# Patient Record
Sex: Female | Born: 1965 | Race: White | Hispanic: No | Smoking: Never smoker
Health system: Southern US, Community
[De-identification: ages and names within clinical notes are randomized; demographics above are authoritative.]

## PROBLEM LIST (undated history)

## (undated) DIAGNOSIS — R42 Dizziness and giddiness: Secondary | ICD-10-CM

## (undated) DIAGNOSIS — Z803 Family history of malignant neoplasm of breast: Secondary | ICD-10-CM

## (undated) DIAGNOSIS — E785 Hyperlipidemia, unspecified: Secondary | ICD-10-CM

## (undated) DIAGNOSIS — R55 Syncope and collapse: Secondary | ICD-10-CM

## (undated) DIAGNOSIS — J45909 Unspecified asthma, uncomplicated: Secondary | ICD-10-CM

## (undated) DIAGNOSIS — Z85828 Personal history of other malignant neoplasm of skin: Secondary | ICD-10-CM

## (undated) DIAGNOSIS — D649 Anemia, unspecified: Secondary | ICD-10-CM

## (undated) DIAGNOSIS — Z800 Family history of malignant neoplasm of digestive organs: Secondary | ICD-10-CM

## (undated) DIAGNOSIS — C801 Malignant (primary) neoplasm, unspecified: Secondary | ICD-10-CM

## (undated) DIAGNOSIS — Z6824 Body mass index (BMI) 24.0-24.9, adult: Secondary | ICD-10-CM

## (undated) DIAGNOSIS — J309 Allergic rhinitis, unspecified: Secondary | ICD-10-CM

## (undated) DIAGNOSIS — Z801 Family history of malignant neoplasm of trachea, bronchus and lung: Secondary | ICD-10-CM

## (undated) HISTORY — DX: Personal history of other malignant neoplasm of skin: Z85.828

## (undated) HISTORY — DX: Family history of malignant neoplasm of digestive organs: Z80.0

## (undated) HISTORY — PX: COLONOSCOPY: SHX174

## (undated) HISTORY — PX: POLYPECTOMY: SHX149

## (undated) HISTORY — DX: Malignant (primary) neoplasm, unspecified: C80.1

## (undated) HISTORY — DX: Allergic rhinitis, unspecified: J30.9

## (undated) HISTORY — DX: Family history of malignant neoplasm of trachea, bronchus and lung: Z80.1

## (undated) HISTORY — PX: OTHER SURGICAL HISTORY: SHX169

## (undated) HISTORY — DX: Unspecified asthma, uncomplicated: J45.909

## (undated) HISTORY — DX: Hyperlipidemia, unspecified: E78.5

## (undated) HISTORY — DX: Dizziness and giddiness: R42

## (undated) HISTORY — DX: Family history of malignant neoplasm of breast: Z80.3

## (undated) HISTORY — DX: Syncope and collapse: R55

## (undated) HISTORY — DX: Anemia, unspecified: D64.9

## (undated) HISTORY — DX: Body mass index (BMI) 24.0-24.9, adult: Z68.24

---

## 1998-04-10 ENCOUNTER — Other Ambulatory Visit: Admission: RE | Admit: 1998-04-10 | Discharge: 1998-04-10 | Payer: Self-pay | Admitting: Obstetrics & Gynecology

## 1999-12-03 ENCOUNTER — Other Ambulatory Visit: Admission: RE | Admit: 1999-12-03 | Discharge: 1999-12-03 | Payer: Self-pay | Admitting: Obstetrics & Gynecology

## 2001-03-10 ENCOUNTER — Other Ambulatory Visit: Admission: RE | Admit: 2001-03-10 | Discharge: 2001-03-10 | Payer: Self-pay | Admitting: Obstetrics & Gynecology

## 2002-02-25 ENCOUNTER — Encounter: Payer: Self-pay | Admitting: Orthopaedic Surgery

## 2002-02-25 ENCOUNTER — Encounter: Admission: RE | Admit: 2002-02-25 | Discharge: 2002-02-25 | Payer: Self-pay | Admitting: Orthopaedic Surgery

## 2002-03-10 ENCOUNTER — Encounter: Payer: Self-pay | Admitting: Orthopaedic Surgery

## 2002-03-10 ENCOUNTER — Encounter: Admission: RE | Admit: 2002-03-10 | Discharge: 2002-03-10 | Payer: Self-pay | Admitting: Orthopaedic Surgery

## 2002-03-25 ENCOUNTER — Encounter: Admission: RE | Admit: 2002-03-25 | Discharge: 2002-03-25 | Payer: Self-pay | Admitting: Orthopaedic Surgery

## 2002-03-25 ENCOUNTER — Encounter: Payer: Self-pay | Admitting: Orthopaedic Surgery

## 2002-05-10 ENCOUNTER — Other Ambulatory Visit: Admission: RE | Admit: 2002-05-10 | Discharge: 2002-05-10 | Payer: Self-pay | Admitting: Obstetrics & Gynecology

## 2003-05-27 ENCOUNTER — Other Ambulatory Visit: Admission: RE | Admit: 2003-05-27 | Discharge: 2003-05-27 | Payer: Self-pay | Admitting: Obstetrics & Gynecology

## 2003-10-28 ENCOUNTER — Ambulatory Visit (HOSPITAL_COMMUNITY): Admission: RE | Admit: 2003-10-28 | Discharge: 2003-10-28 | Payer: Self-pay | Admitting: Obstetrics & Gynecology

## 2004-08-14 ENCOUNTER — Other Ambulatory Visit: Admission: RE | Admit: 2004-08-14 | Discharge: 2004-08-14 | Payer: Self-pay | Admitting: Obstetrics & Gynecology

## 2005-08-27 ENCOUNTER — Other Ambulatory Visit: Admission: RE | Admit: 2005-08-27 | Discharge: 2005-08-27 | Payer: Self-pay | Admitting: Obstetrics & Gynecology

## 2009-11-24 ENCOUNTER — Encounter: Admission: RE | Admit: 2009-11-24 | Discharge: 2009-11-24 | Payer: Self-pay | Admitting: Obstetrics & Gynecology

## 2010-10-26 NOTE — Op Note (Signed)
NAME:  Katherine Ritter, Katherine Ritter                         ACCOUNT NO.:  [id number]   MEDICAL RECORD NO.:  [id number]                   PATIENT TYPE:  AMB   LOCATION:  [facility]                                  FACILITY:  [facility]   PHYSICIAN:  Ilda Mori, M.D.                DATE OF BIRTH:  02-Sep-1965   DATE OF PROCEDURE:  10/28/2003  DATE OF DISCHARGE:                                 OPERATIVE REPORT   PREOPERATIVE DIAGNOSES:  Left Bartholin cyst.   POSTOPERATIVE DIAGNOSES:  Left Bartholin cyst.   PROCEDURE:  Marsupialization of left Bartholin cyst.   SURGEON:  Ilda Mori, M.D.   ANESTHESIA:  Local with IV sedation.   ESTIMATED BLOOD LOSS:  10 mL.   FINDINGS:  A 3 cm Bartholin cyst in the left labia majora.   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 Recently the Bartholin cyst began to enlarge  and the patient requested that it be removed. It was felt that doing a  marsupialization in the operating room had the best chance of no recurrence  of the lesion.   DESCRIPTION OF PROCEDURE:  The patient was taken to the operating room and  IV sedation was administered. The vagina and vulva were prepped and draped  in a sterile fashion.  1 mL of 1% lidocaine was infiltrated over the  Bartholin cyst at the mucocutaneous junction on the left side. An incision  was then made and carried down to the cyst which was then entered sharply  and extended bluntly.  A 3-0 Vicryl suture was placed, marsupialized  __________ to the surrounding skin and each incision placed at 12 o'clock, 2  o'clock, 4 o'clock, 6 o'clock, 8 o'clock and 10 o'clock. The patient  tolerated the procedure well and left the operating room in good condition.                                               Ilda Mori, M.D.    RK/MEDQ  D:  10/28/2003  T:  10/29/2003  Job:  [id number]

## 2010-11-07 ENCOUNTER — Other Ambulatory Visit: Payer: Self-pay | Admitting: Obstetrics & Gynecology

## 2010-11-07 DIAGNOSIS — Z1231 Encounter for screening mammogram for malignant neoplasm of breast: Secondary | ICD-10-CM

## 2010-12-04 ENCOUNTER — Ambulatory Visit
Admission: RE | Admit: 2010-12-04 | Discharge: 2010-12-04 | Disposition: A | Discharge status: Home / Self Care | Payer: PRIVATE HEALTH INSURANCE | Source: Ambulatory Visit | Attending: Obstetrics & Gynecology | Admitting: Obstetrics & Gynecology

## 2010-12-04 DIAGNOSIS — Z1231 Encounter for screening mammogram for malignant neoplasm of breast: Secondary | ICD-10-CM

## 2011-12-02 ENCOUNTER — Other Ambulatory Visit: Payer: Self-pay | Admitting: Obstetrics & Gynecology

## 2011-12-02 DIAGNOSIS — Z1231 Encounter for screening mammogram for malignant neoplasm of breast: Secondary | ICD-10-CM

## 2011-12-11 ENCOUNTER — Ambulatory Visit
Admission: RE | Admit: 2011-12-11 | Discharge: 2011-12-11 | Disposition: A | Discharge status: Home / Self Care | Payer: PRIVATE HEALTH INSURANCE | Source: Ambulatory Visit | Attending: Obstetrics & Gynecology | Admitting: Obstetrics & Gynecology

## 2011-12-11 DIAGNOSIS — Z1231 Encounter for screening mammogram for malignant neoplasm of breast: Secondary | ICD-10-CM

## 2011-12-11 IMAGING — MG MM DIGITAL SCREENING BILAT
4 series · 4 of 4 positions shown · non-contrast
Comparison: Previous exams

CLINICAL DATA: Screening.

DIGITAL SCREENING MAMMOGRAM WITH CAD

[R CC]
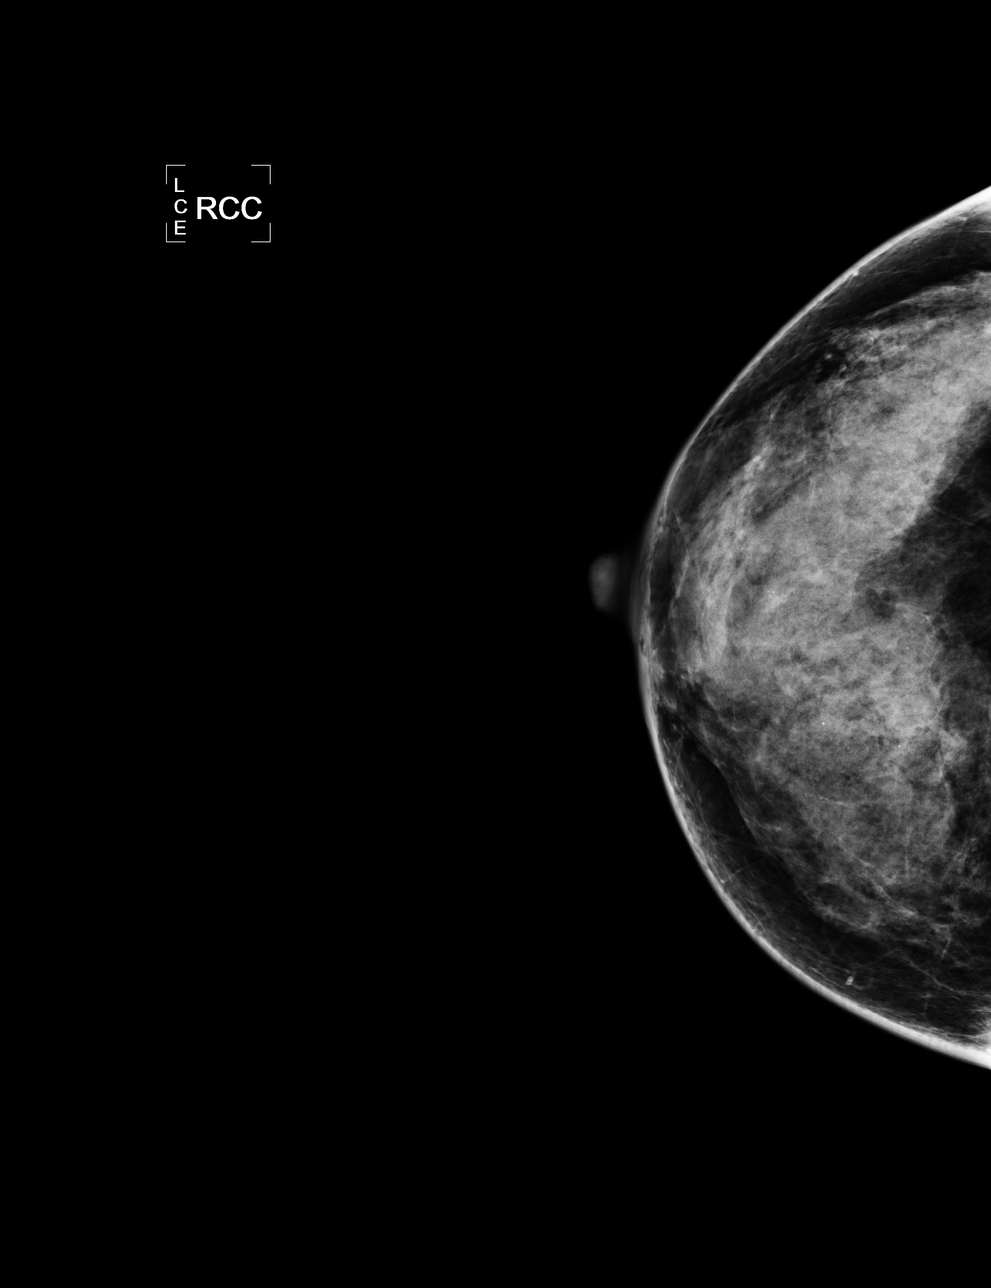

[L CC]
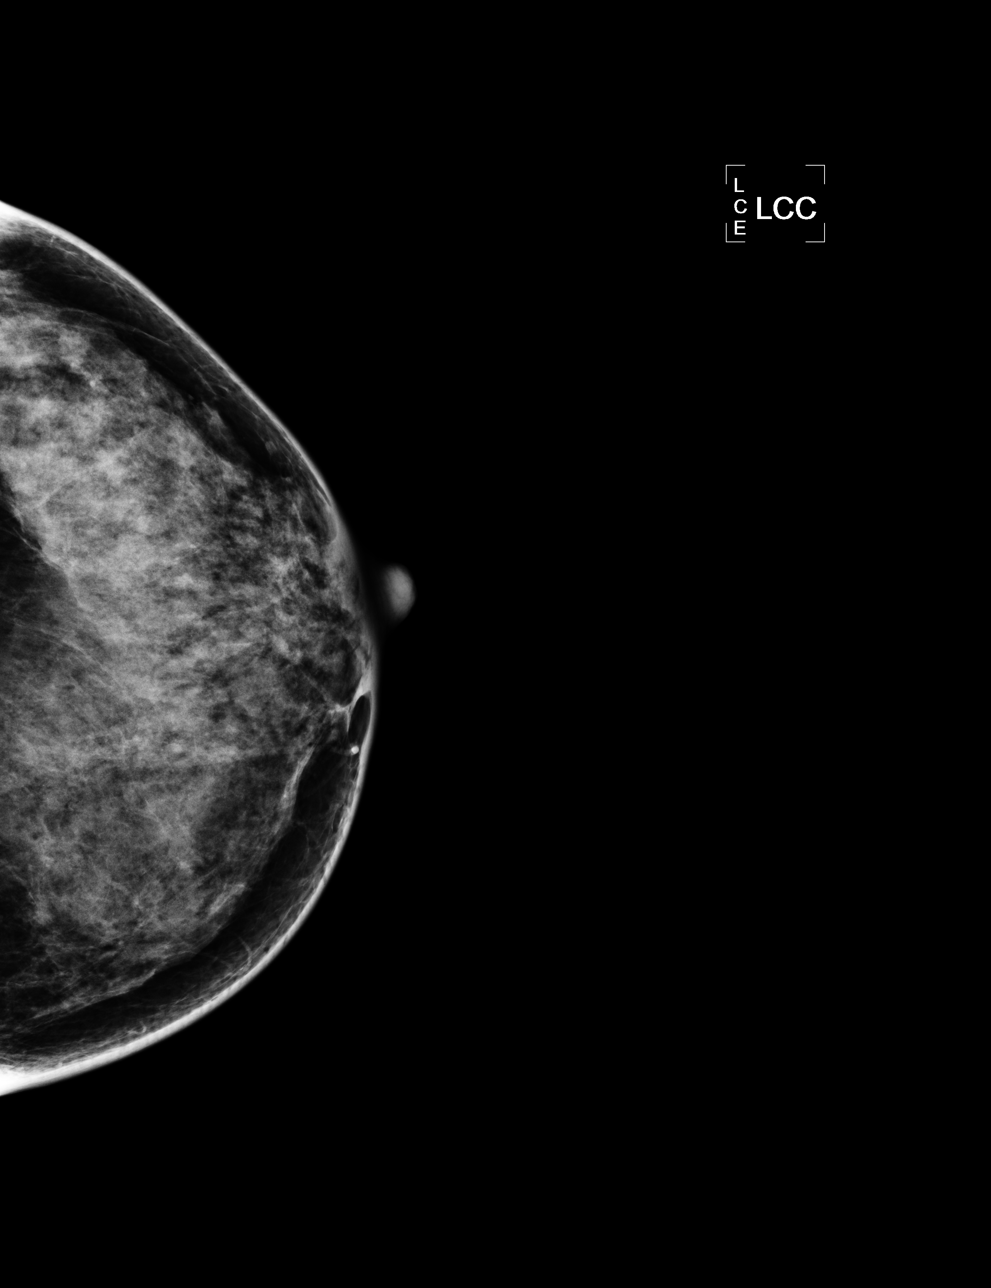

[L MLO]
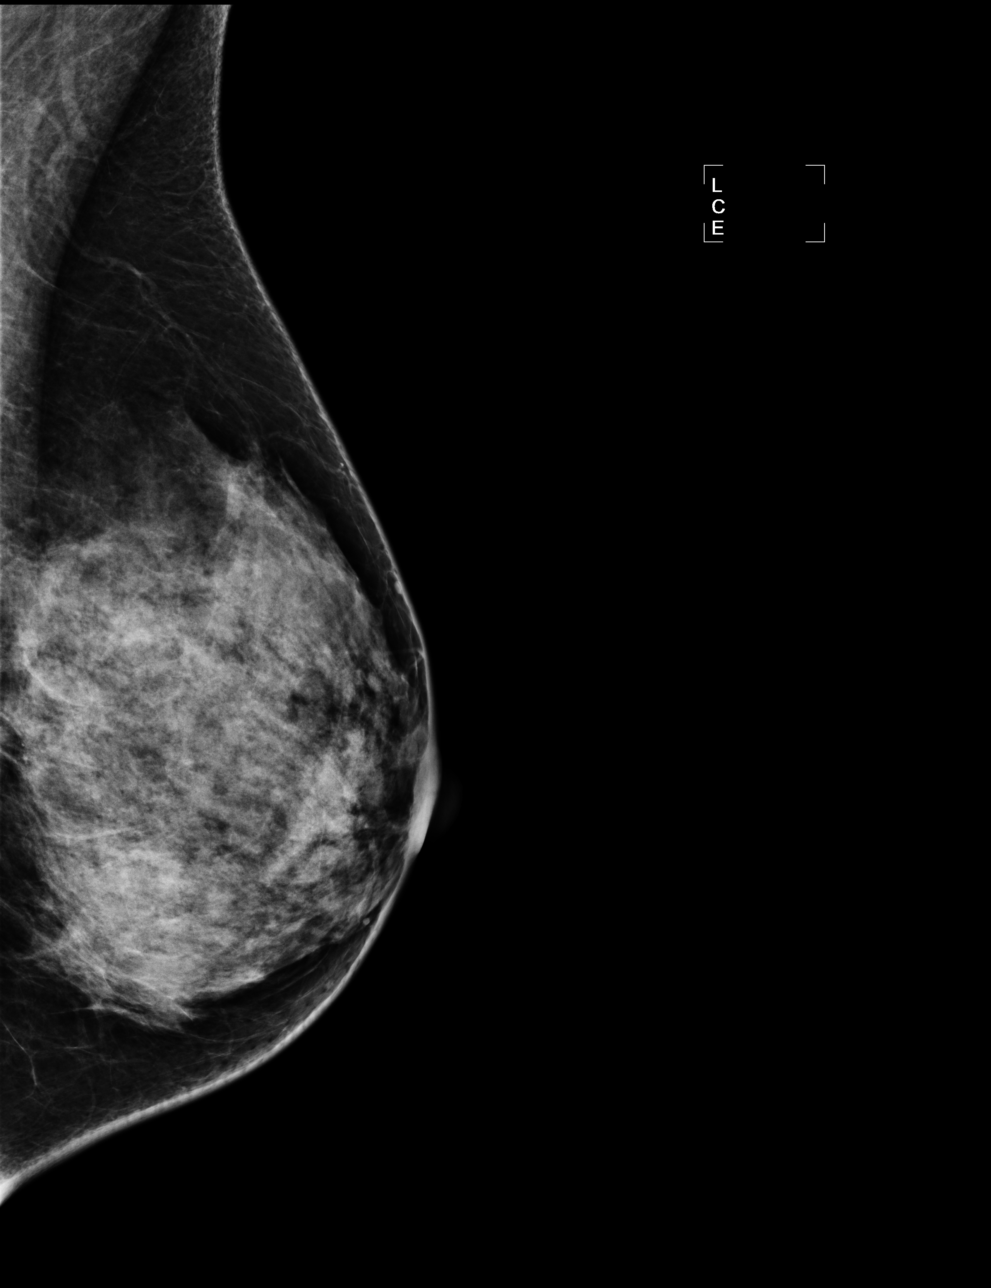

[R MLO]
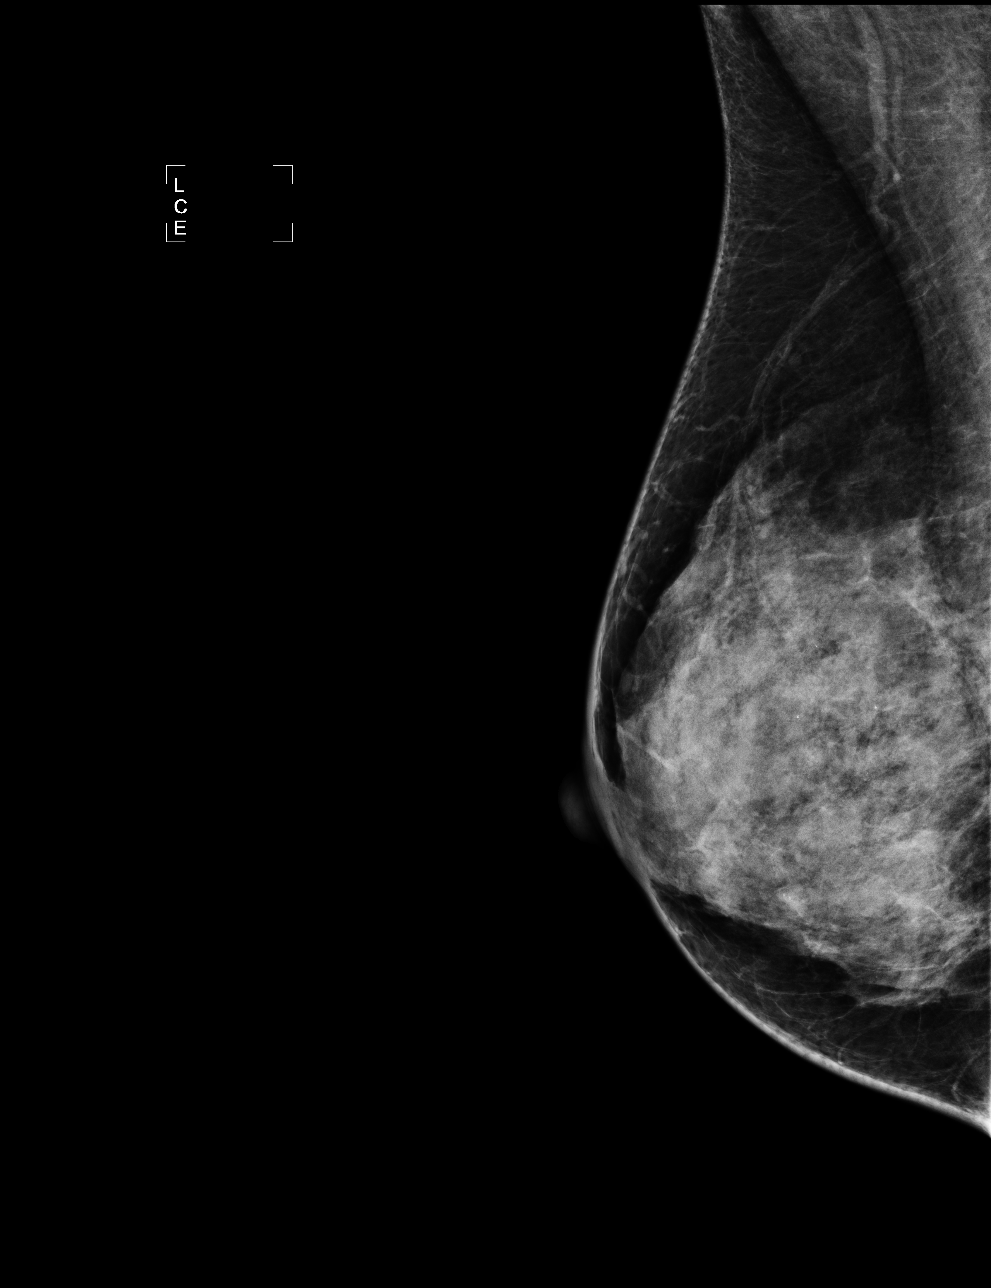

[4 of 4 positions shown; findings below may reference images not displayed]

FINDINGS: The breast tissue is extremely dense. No suspicious
masses, architectural distortion, or calcifications are present.

Images were processed with CAD.
IMPRESSION: No specific mammographic evidence of malignancy.

A result letter of this screening mammogram will be mailed directly
to the patient.

RECOMMENDATION:
Screening mammogram in one year. (Code:[WG])

BI-RADS CATEGORY 1:  Negative

## 2013-04-15 ENCOUNTER — Other Ambulatory Visit: Payer: Self-pay

## 2013-04-15 DIAGNOSIS — Z1231 Encounter for screening mammogram for malignant neoplasm of breast: Secondary | ICD-10-CM

## 2013-05-31 ENCOUNTER — Ambulatory Visit
Admission: RE | Admit: 2013-05-31 | Discharge: 2013-05-31 | Disposition: A | Discharge status: Home / Self Care | Payer: PRIVATE HEALTH INSURANCE | Source: Ambulatory Visit

## 2013-05-31 DIAGNOSIS — Z1231 Encounter for screening mammogram for malignant neoplasm of breast: Secondary | ICD-10-CM

## 2013-05-31 IMAGING — MG STANDARD SCREENING - COMBO
8 series · 9 of 24 positions shown · non-contrast
Comparison: Previous exam(s).

CLINICAL DATA: Screening.

EXAM:
DIGITAL SCREENING BILATERAL MAMMOGRAM WITH CAD
DIGITAL BREAST TOMOSYNTHESIS
Digital breast tomosynthesis images are acquired in two projections.
These images are reviewed in combination with the digital mammogram,
confirming the findings below.

[R CC]
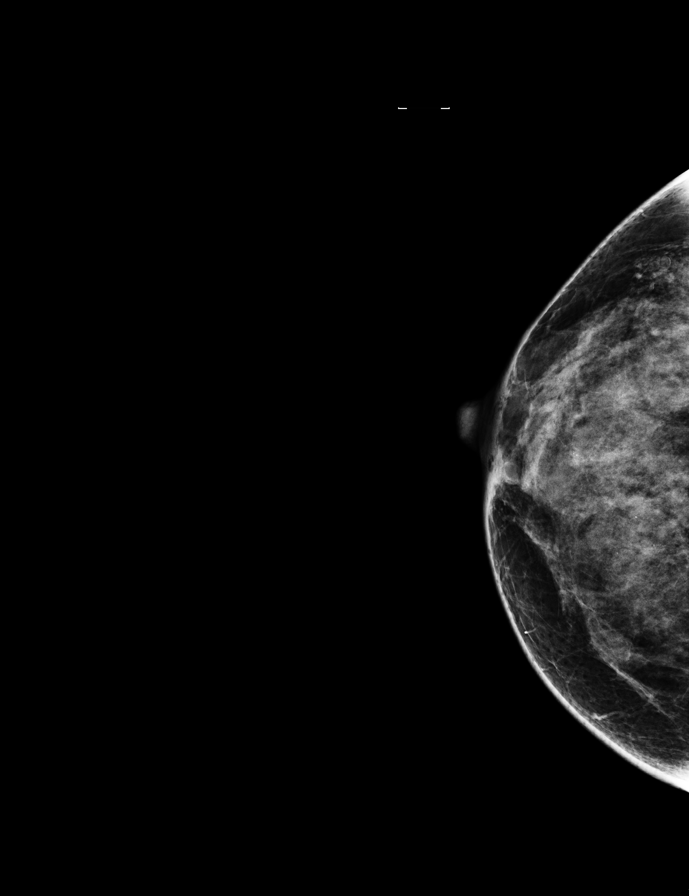

[R MLO]
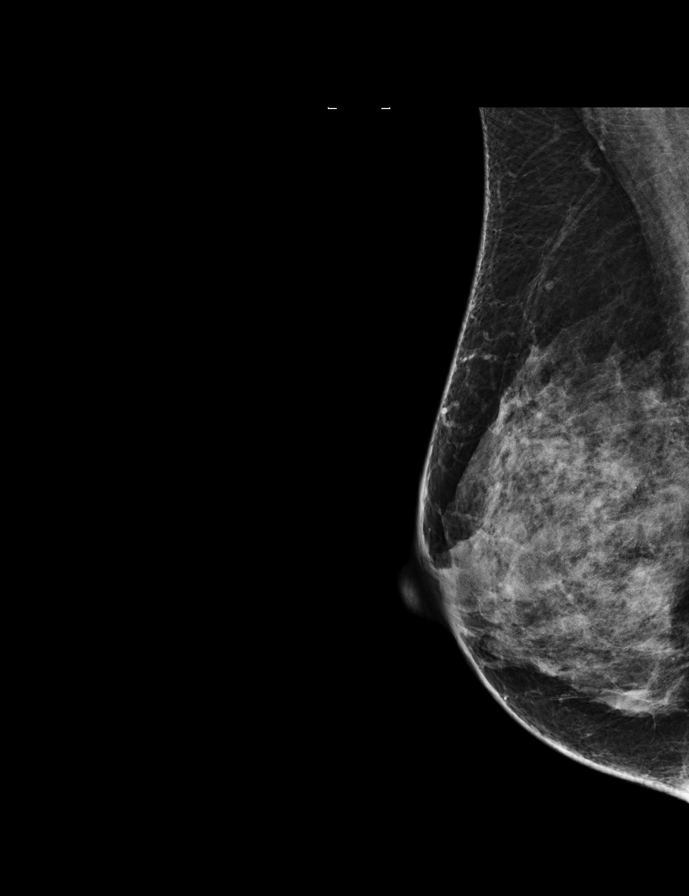

[L CC]
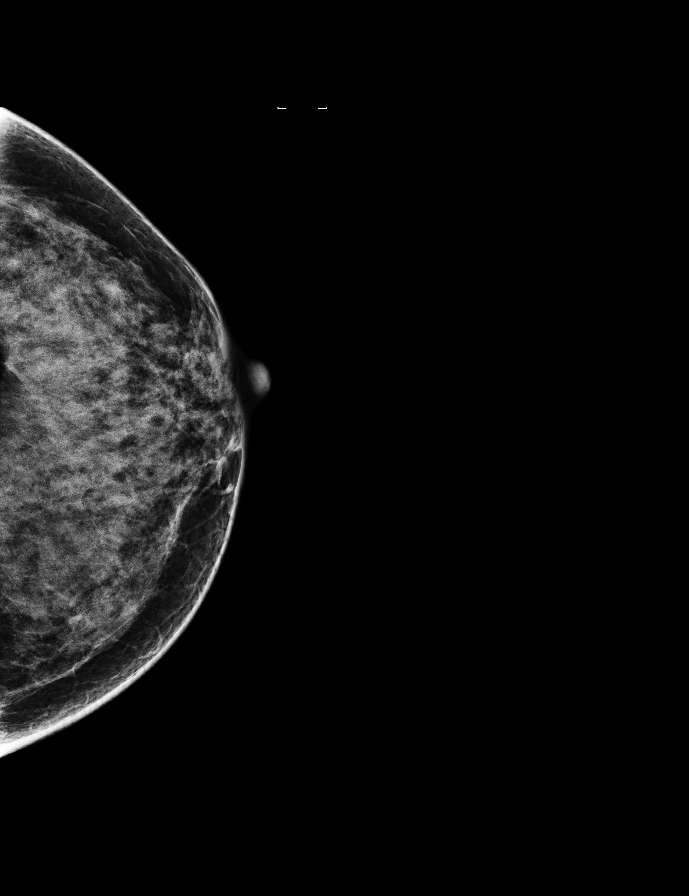

[L MLO]
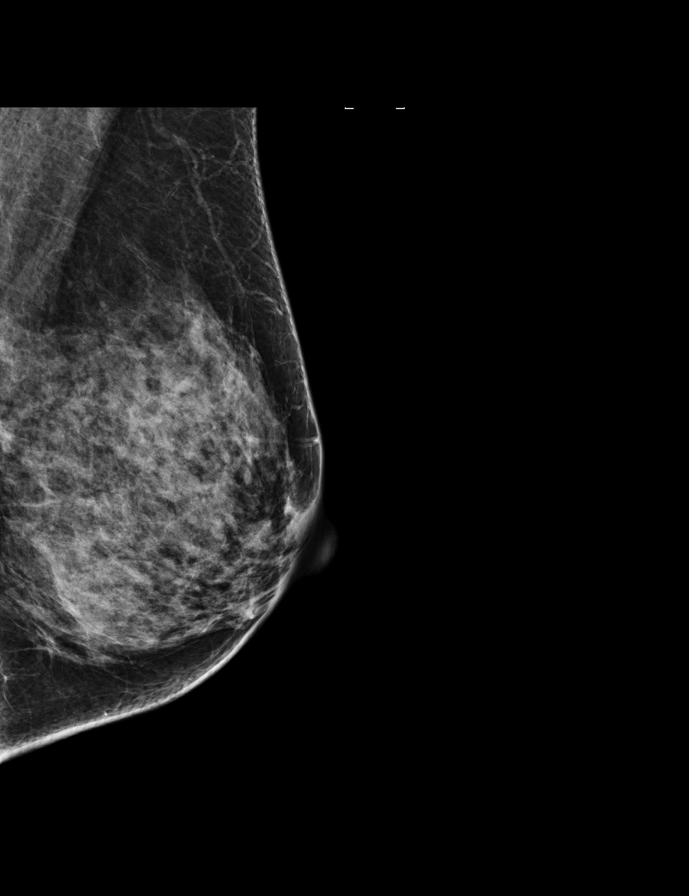

[R MLO tomo · 2 of 54 frames shown]
[frame 18/54]
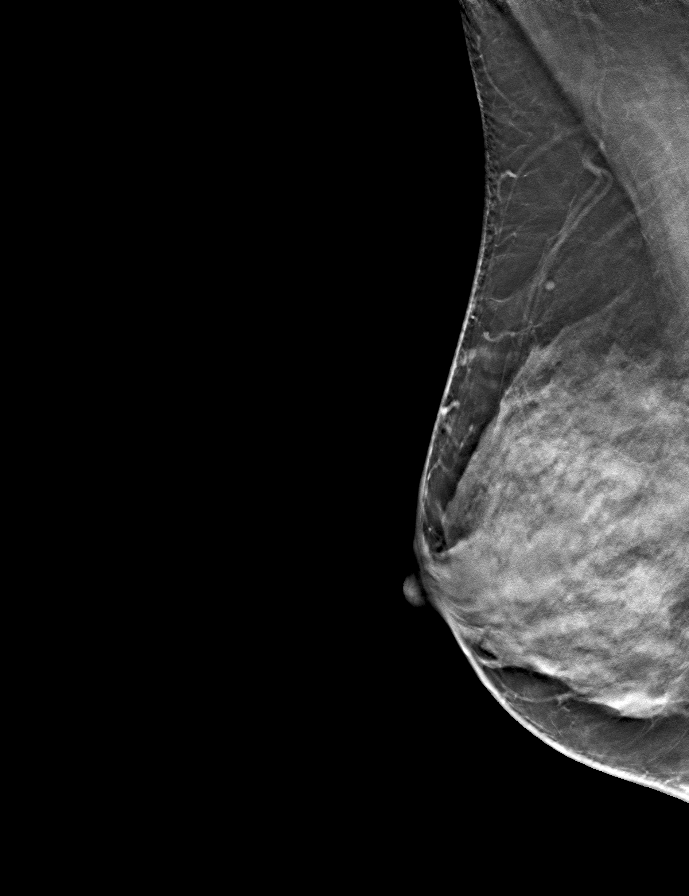
[frame 27/54]
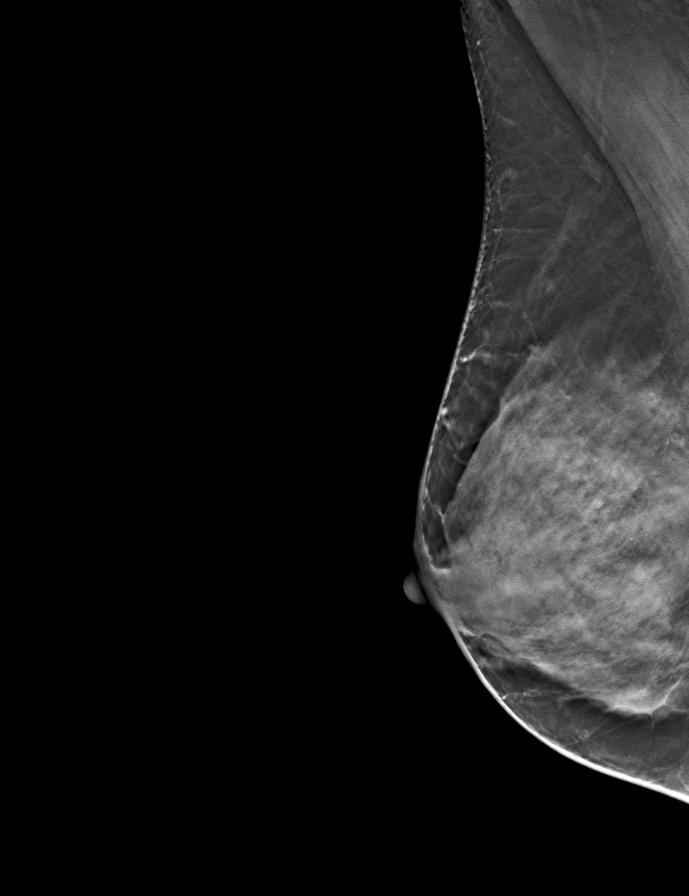

[R CC tomo · tomo slice 25/50.0]
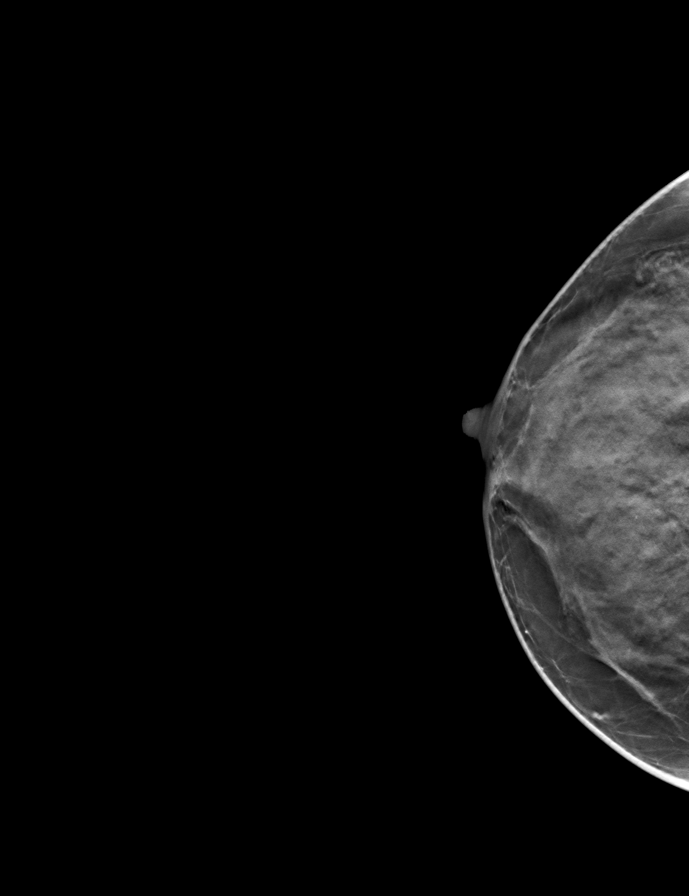

[L CC tomo · tomo slice 27/53.0]
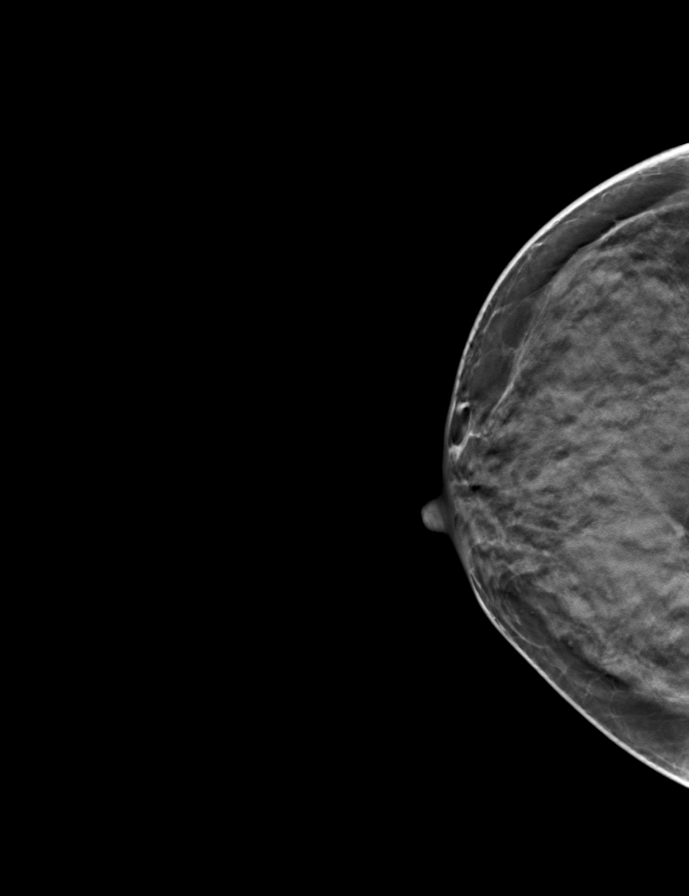

[L MLO tomo · tomo slice 29/57.0]
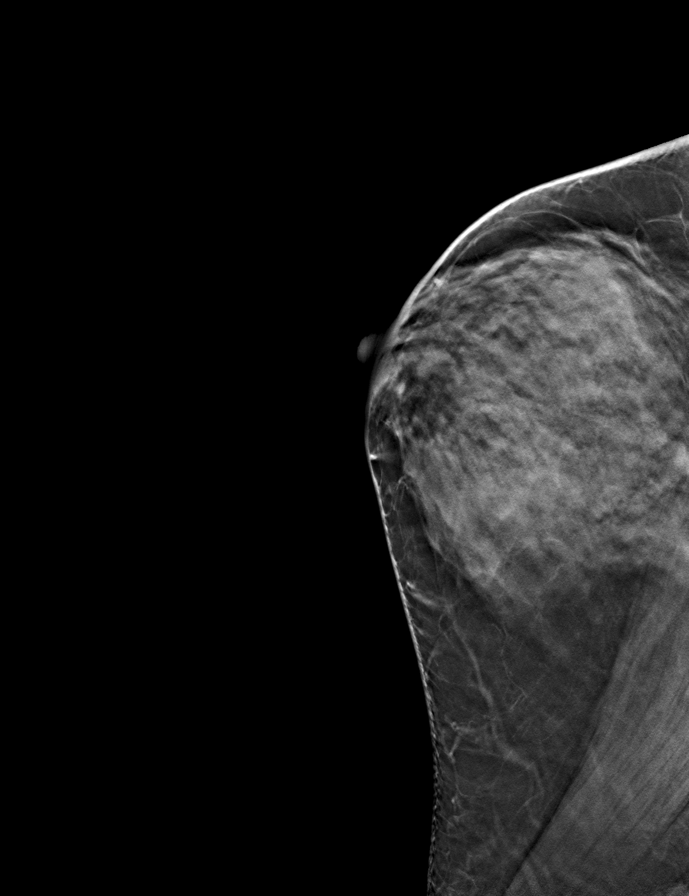

[9 of 24 positions shown; findings below may reference images not displayed]

ACR Breast Density Category d: The breasts are extremely dense,
which lowers the sensitivity of mammography.
FINDINGS: There are no findings suspicious for malignancy. Images were
processed with CAD.
IMPRESSION: No mammographic evidence of malignancy. A result letter of this
screening mammogram will be mailed directly to the patient.

RECOMMENDATION:
Screening mammogram in one year. (Code:[0N])

BI-RADS CATEGORY  1: Negative

## 2014-06-10 HISTORY — PX: BREAST BIOPSY: SHX20

## 2014-10-05 ENCOUNTER — Other Ambulatory Visit: Payer: Self-pay

## 2014-10-05 DIAGNOSIS — Z1231 Encounter for screening mammogram for malignant neoplasm of breast: Secondary | ICD-10-CM

## 2014-10-25 ENCOUNTER — Ambulatory Visit
Admission: RE | Admit: 2014-10-25 | Discharge: 2014-10-25 | Disposition: A | Discharge status: Home / Self Care | Payer: PRIVATE HEALTH INSURANCE | Source: Ambulatory Visit

## 2014-10-25 ENCOUNTER — Encounter (INDEPENDENT_AMBULATORY_CARE_PROVIDER_SITE_OTHER): Payer: Self-pay

## 2014-10-25 DIAGNOSIS — Z1231 Encounter for screening mammogram for malignant neoplasm of breast: Secondary | ICD-10-CM

## 2014-10-25 IMAGING — MG MM SCREENING BREAST TOMO BILATERAL
8 series · 9 of 24 positions shown · non-contrast
Comparison: Previous exam(s).

CLINICAL DATA: Screening.

EXAM:
DIGITAL SCREENING BILATERAL MAMMOGRAM WITH 3D TOMO WITH CAD

[R CC]
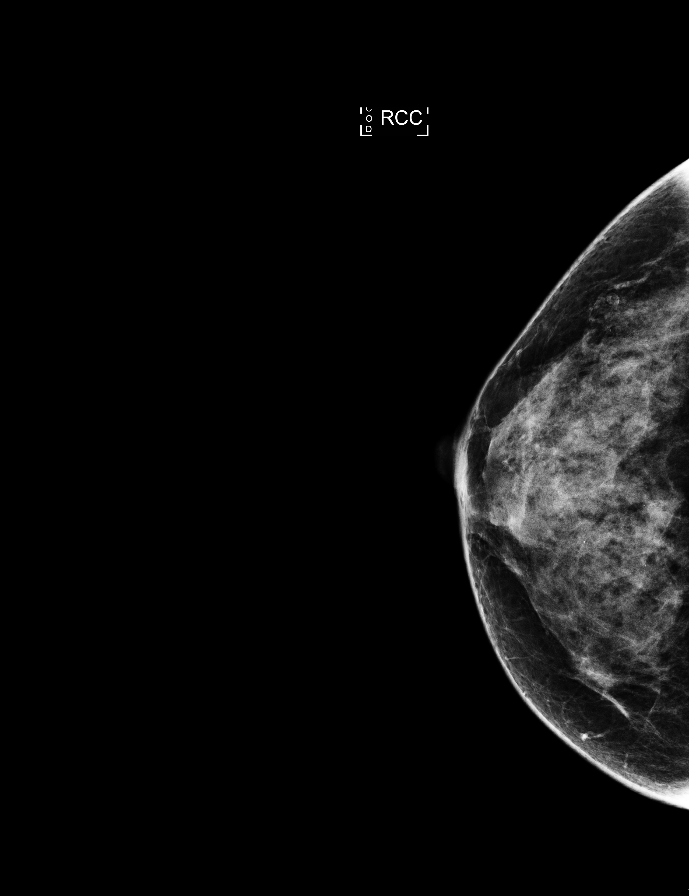

[R MLO]
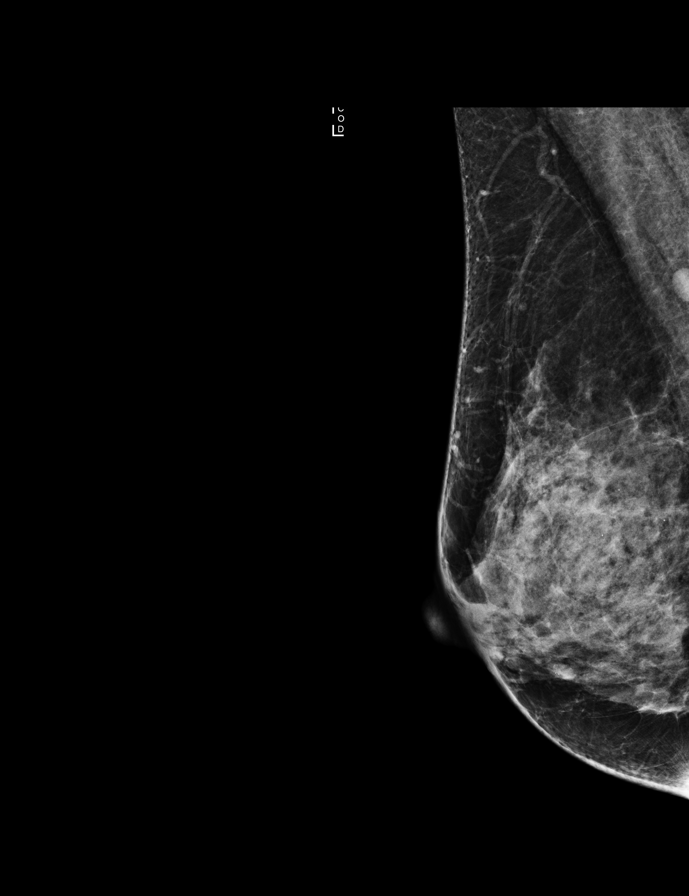

[L MLO]
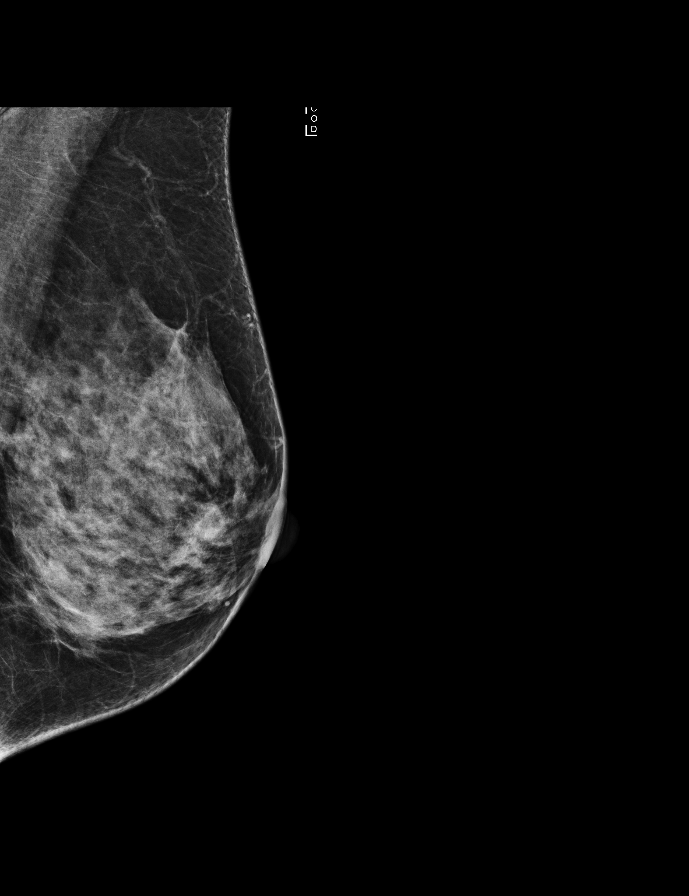

[L CC]
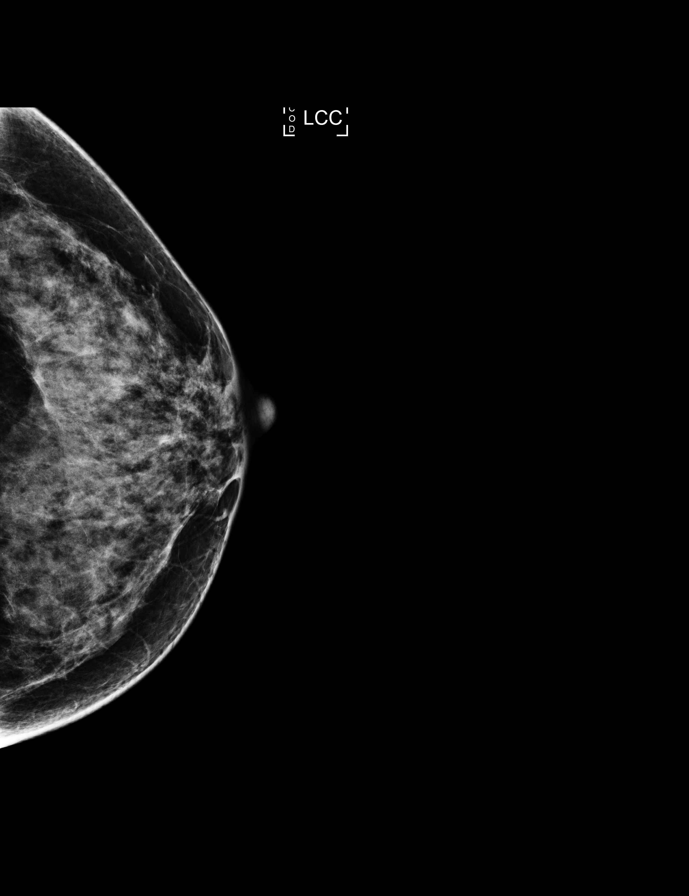

[L MLO tomo · 2 of 60 frames shown]
[frame 20/60]
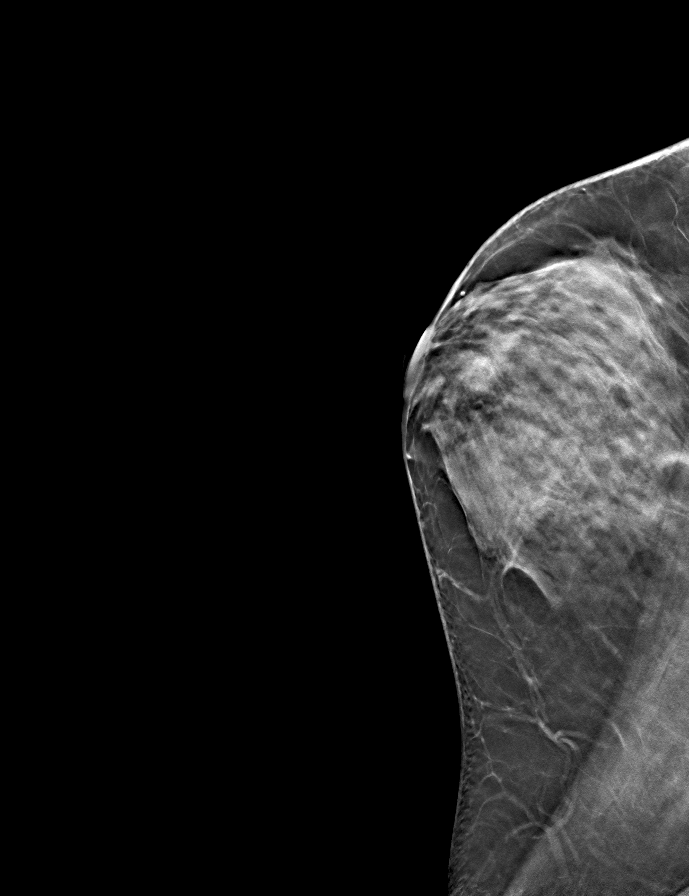
[frame 31/60]
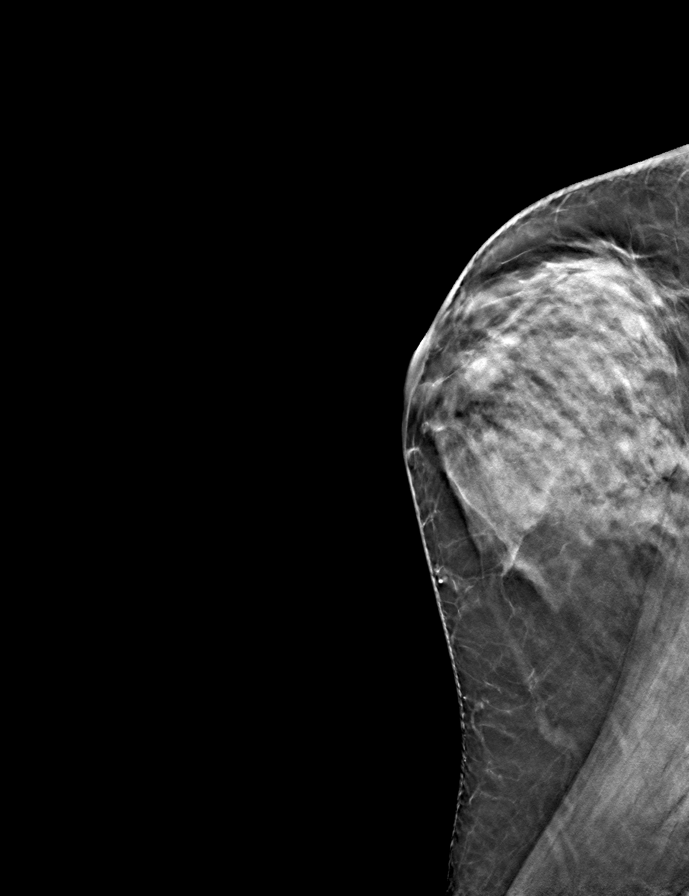

[L CC tomo · tomo slice 27/53.0]
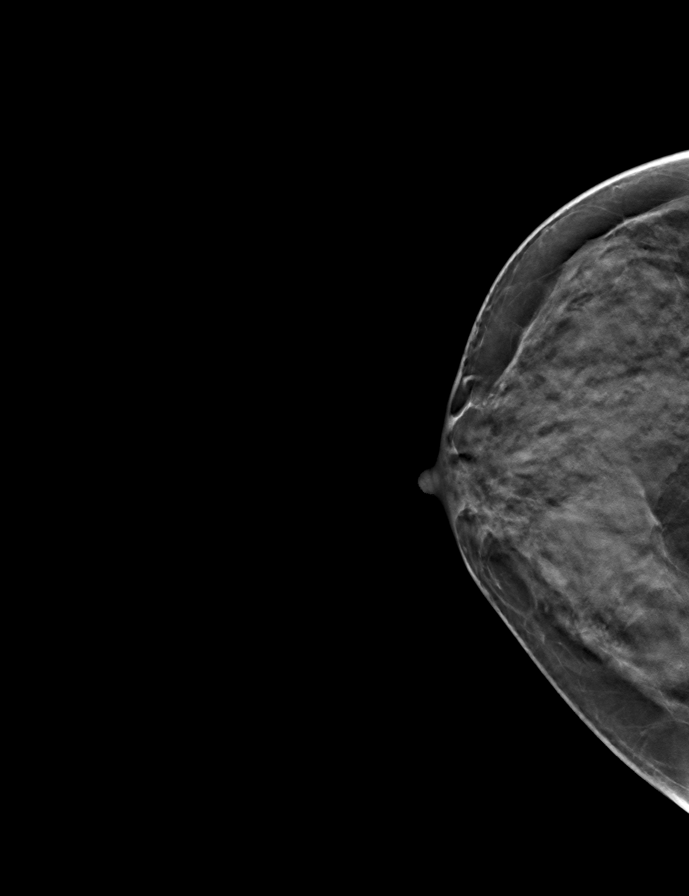

[R MLO tomo · tomo slice 27/53.0]
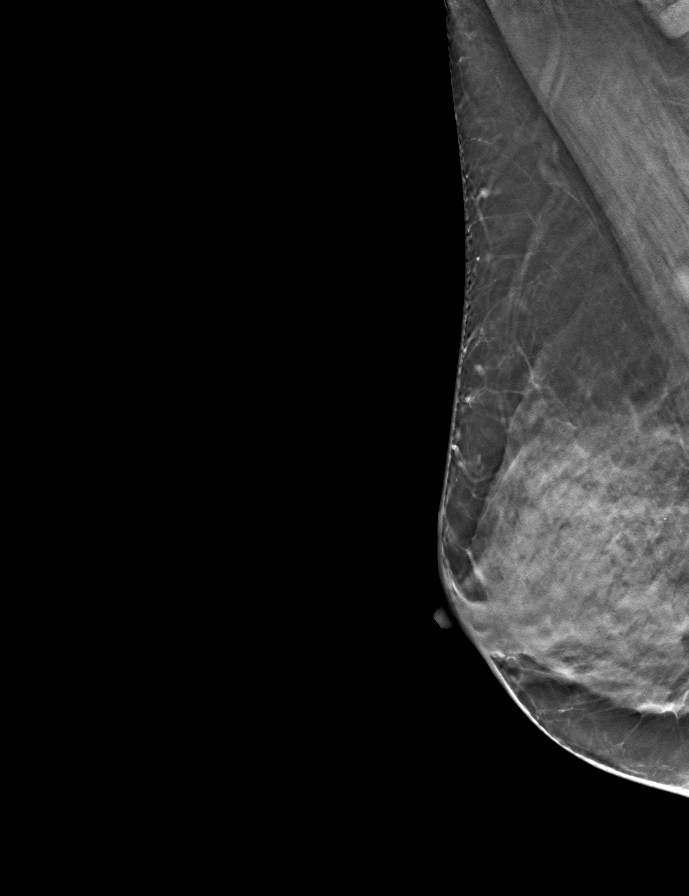

[R CC tomo · tomo slice 25/50.0]
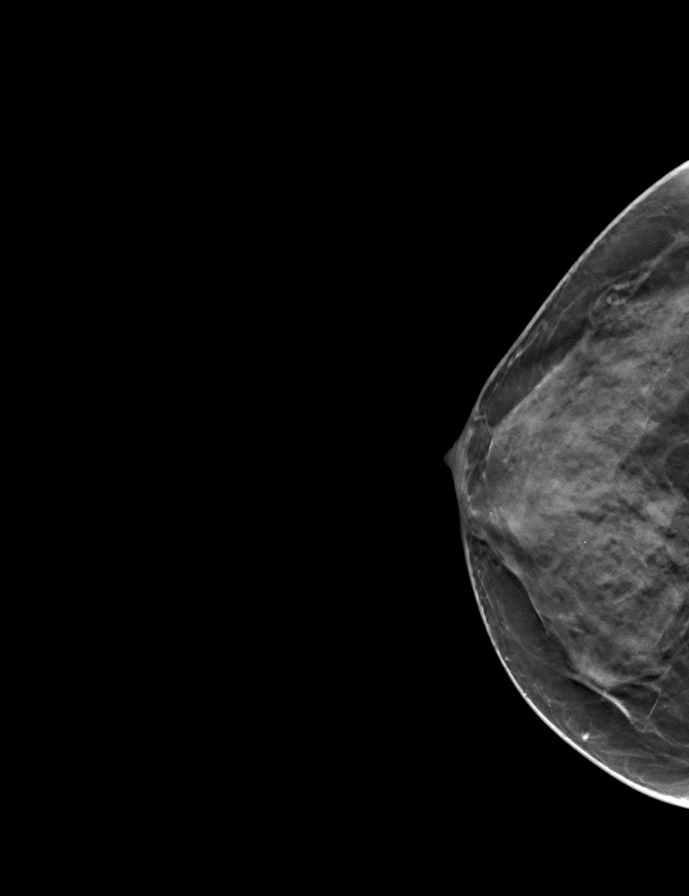

[9 of 24 positions shown; findings below may reference images not displayed]

ACR Breast Density Category d: The breast tissue is extremely dense,
which lowers the sensitivity of mammography.
FINDINGS: In the right breast, a possible mass warrants further evaluation. In
the left breast, no findings suspicious for malignancy. Images were
processed with CAD.
IMPRESSION: Further evaluation is suggested for possible mass in the right
breast.

RECOMMENDATION:
Diagnostic mammogram and possibly ultrasound of the right breast.
(Code:[ED])

The patient will be contacted regarding the findings, and additional
imaging will be scheduled.

BI-RADS CATEGORY  0: Incomplete. Need additional imaging evaluation
and/or prior mammograms for comparison.

## 2014-10-27 ENCOUNTER — Other Ambulatory Visit: Payer: Self-pay | Admitting: Obstetrics & Gynecology

## 2014-10-27 DIAGNOSIS — R928 Other abnormal and inconclusive findings on diagnostic imaging of breast: Secondary | ICD-10-CM

## 2014-11-01 ENCOUNTER — Ambulatory Visit
Admission: RE | Admit: 2014-11-01 | Discharge: 2014-11-01 | Disposition: A | Discharge status: Home / Self Care | Payer: PRIVATE HEALTH INSURANCE | Source: Ambulatory Visit | Attending: Obstetrics & Gynecology | Admitting: Obstetrics & Gynecology

## 2014-11-01 DIAGNOSIS — R928 Other abnormal and inconclusive findings on diagnostic imaging of breast: Secondary | ICD-10-CM

## 2014-11-01 IMAGING — MG MM DIAG BREAST TOMO UNI RIGHT
4 series · 4 of 12 positions shown · non-contrast
Comparison: [DATE], [DATE], additional prior studies dating
back to [DATE]

CLINICAL DATA: 48-year-old female, callback from screening
mammogram for possible right breast mass

EXAM:
DIGITAL DIAGNOSTIC RIGHT MAMMOGRAM WITH 3D TOMOSYNTHESIS WITH CAD
ULTRASOUND RIGHT BREAST

[R XCCL]
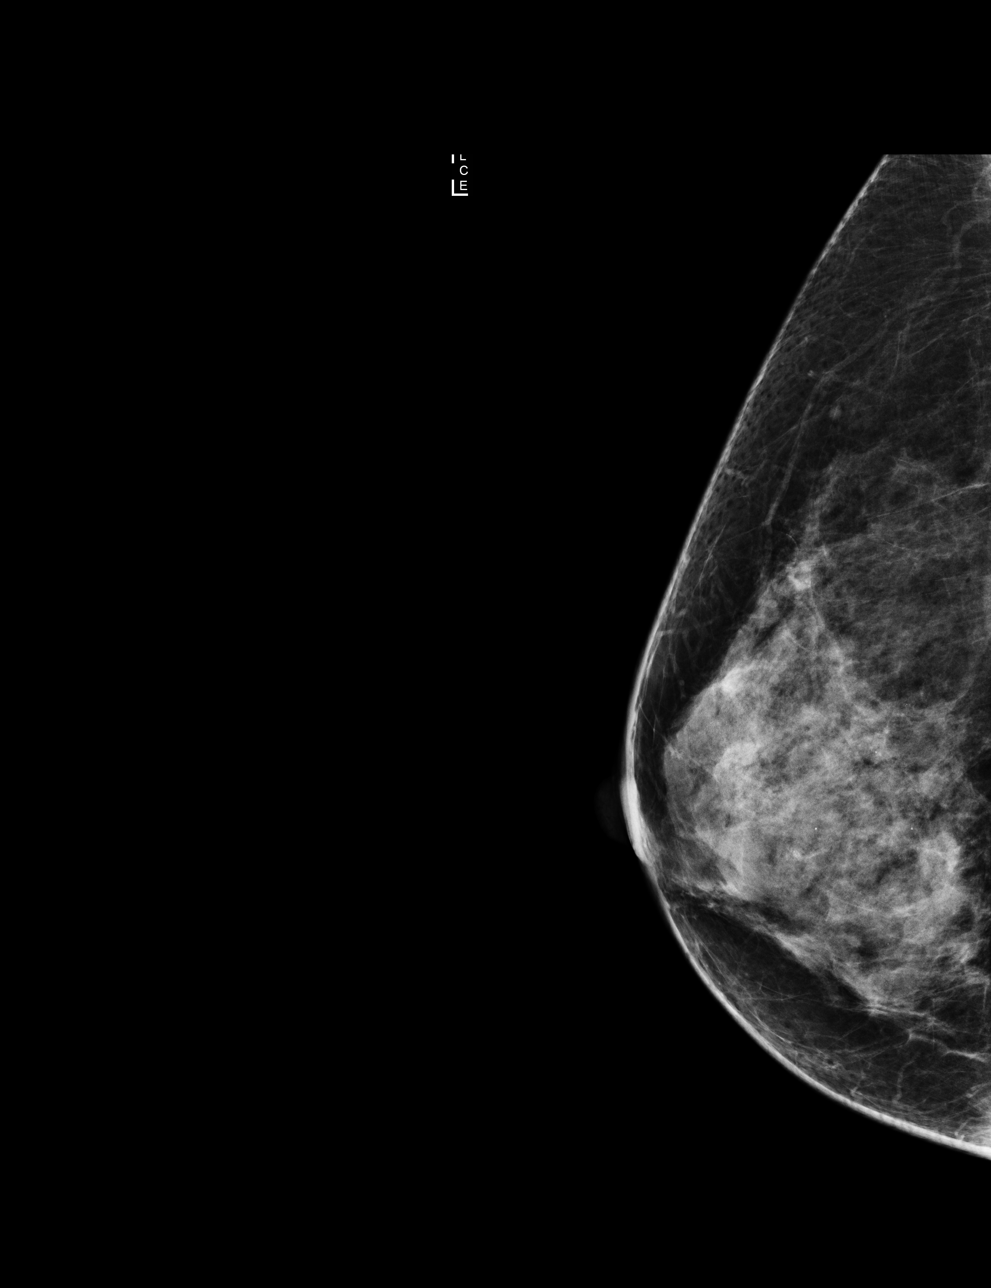

[R MLO]
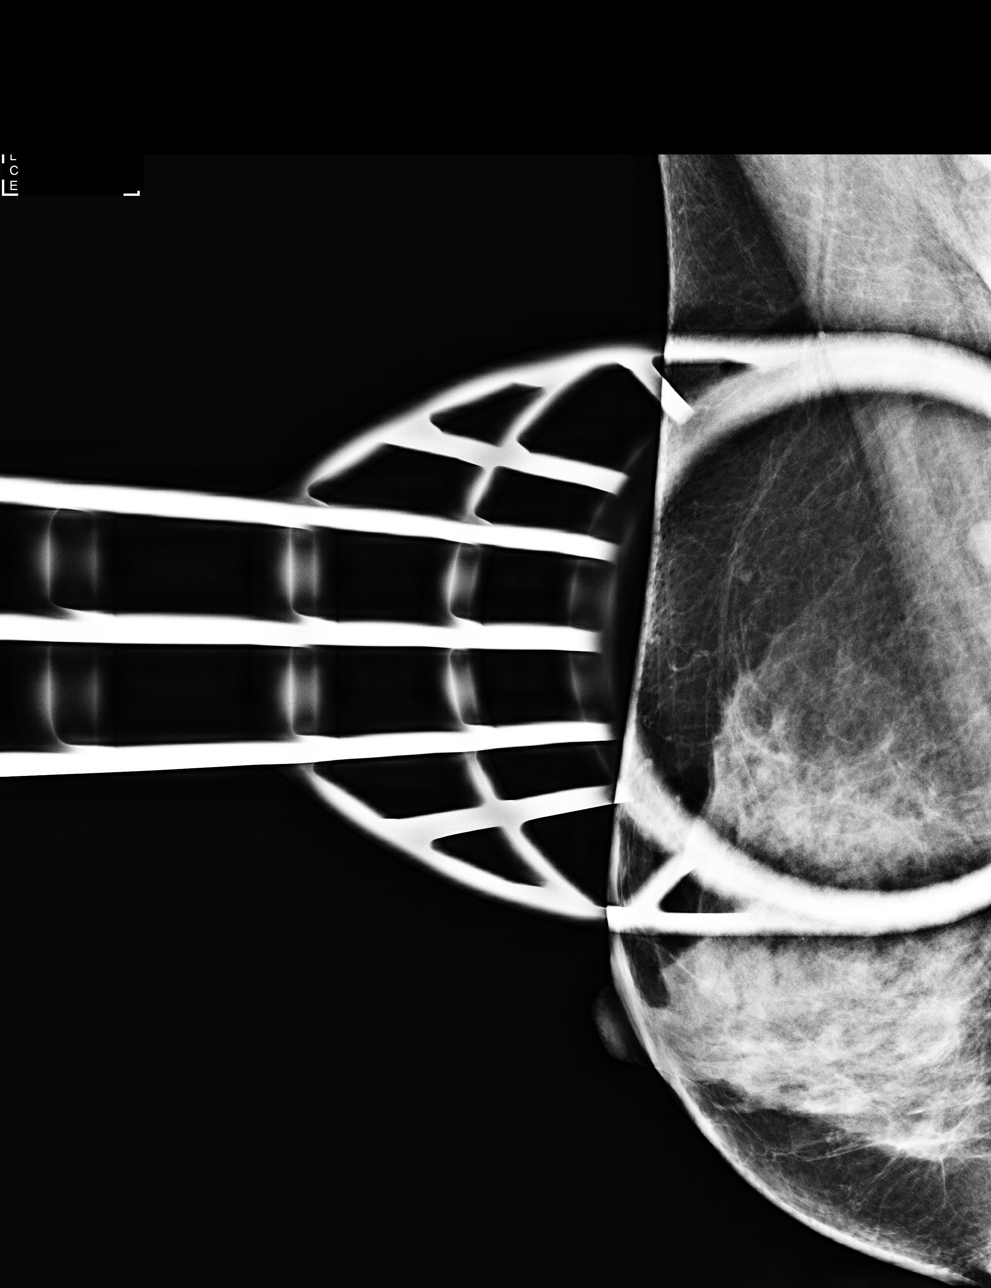

[R MLO tomo · tomo slice 25/49.0]
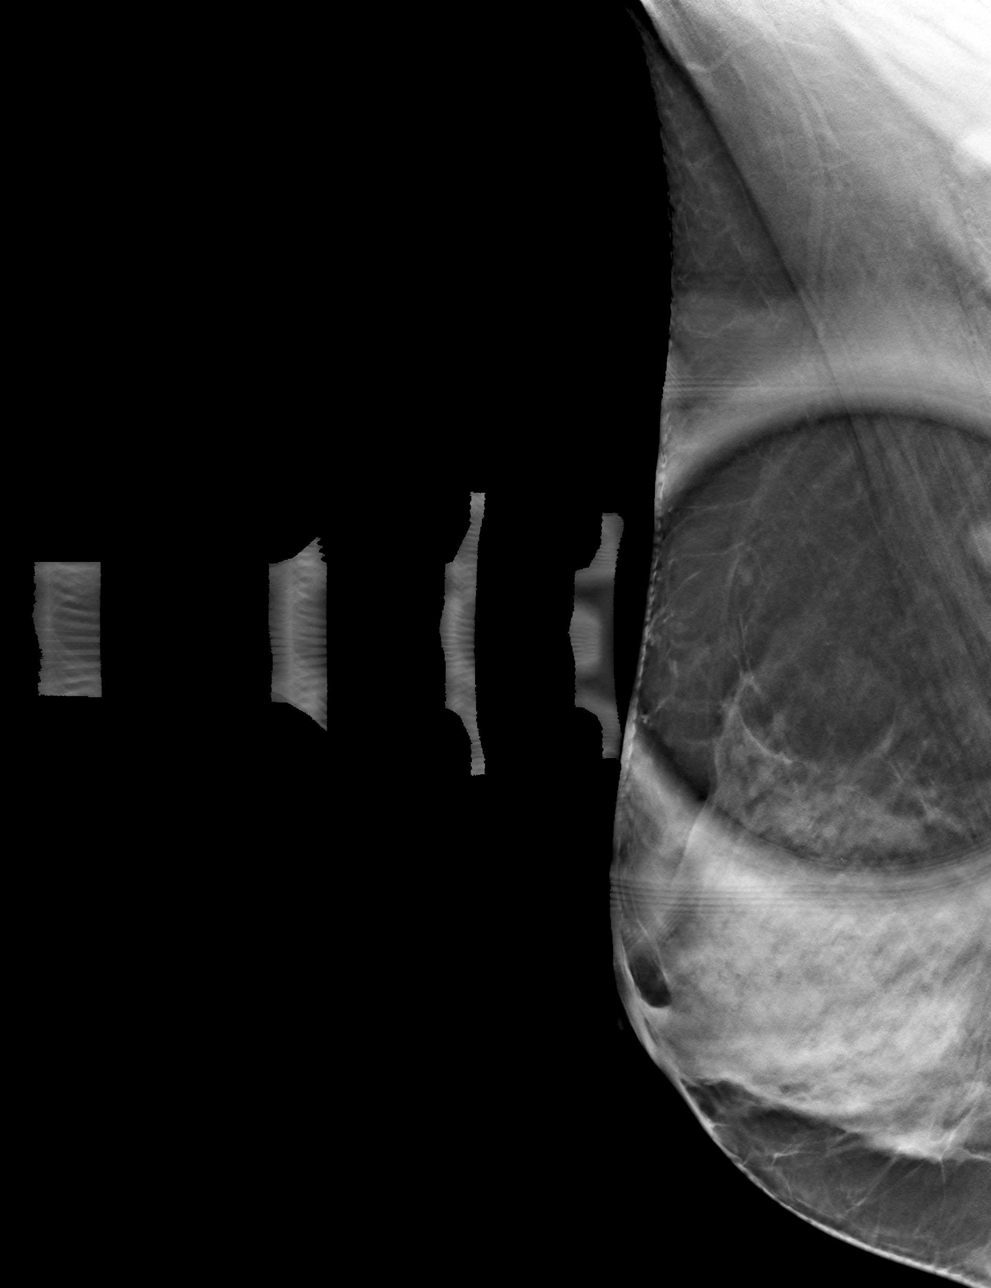

[R XCCL tomo · tomo slice 29/57.0]
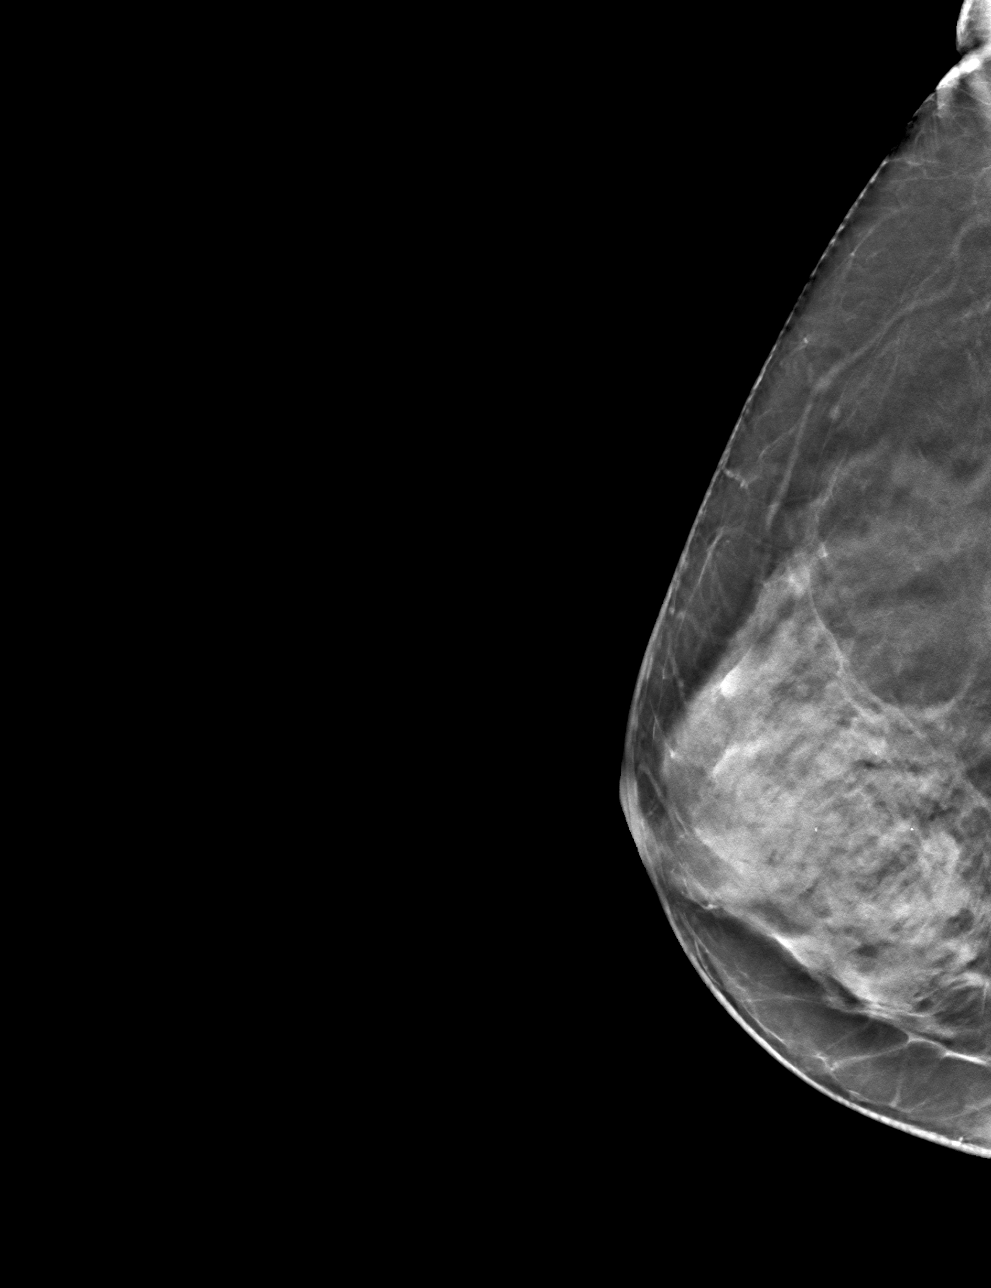

[4 of 12 positions shown; findings below may reference images not displayed]

ACR Breast Density Category c: The breast tissue is heterogeneously
dense, which may obscure small masses.
FINDINGS: XCCL and spot compression MLO views of the right breast with
tomosynthesis were performed. On the spot-compression MLO view, the
possible mass seen on screening mammogram is again noted and
partially imaged. The imaged portion of the mass demonstrates
circumscribed margins.

Mammographic images were processed with CAD.

On physical exam, no discrete mass is felt within the with area of
concern within the upper, outer right breast.

Targeted ultrasound is performed, showing a normal appearing
intramammary lymph node at 10 o'clock, 8 cm from the nipple
measuring 7 x 6 x 4 mm. The cortex measures up to 3 mm in size. This
lymph node is thought to correspond to the mammographic finding. No
suspicious cystic or solid sonographic finding is seen within this
area. This lymph node may have been present previously but not
included on prior exams due to differences in patient positioning.
IMPRESSION: Probably benign right intramammary lymph node.

RECOMMENDATION:
Right breast ultrasound in 6 months.

I have discussed the findings and recommendations with the patient.
Results were also provided in writing at the conclusion of the
visit. If applicable, a reminder letter will be sent to the patient
regarding the next appointment.

BI-RADS CATEGORY  3: Probably benign.

## 2014-11-01 IMAGING — US US BREAST LTD UNI RIGHT INC AXILLA
1 series · 11 of 11 positions shown · non-contrast
Comparison: [DATE], [DATE], additional prior studies dating
back to [DATE]

CLINICAL DATA: 48-year-old female, callback from screening
mammogram for possible right breast mass

EXAM:
DIGITAL DIAGNOSTIC RIGHT MAMMOGRAM WITH 3D TOMOSYNTHESIS WITH CAD
ULTRASOUND RIGHT BREAST

[Series 1: us breast ltd uni right inc axilla · 0.03mm/px · 11 of 11 slices shown]
[im 1/11]
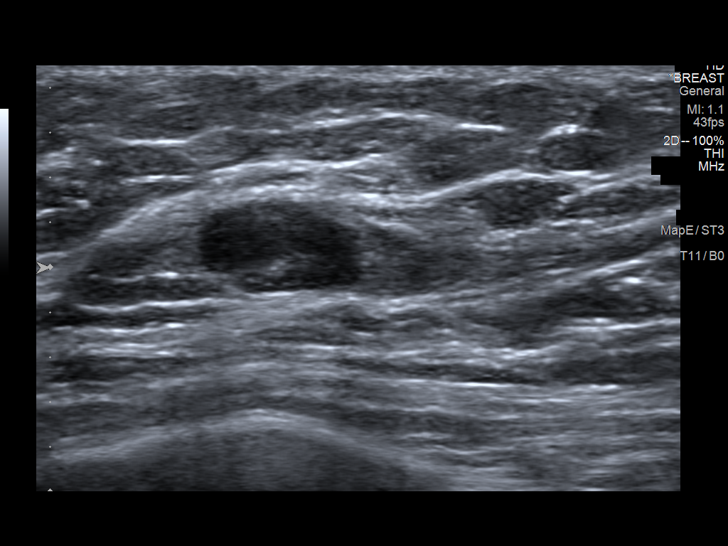
[im 2/11]
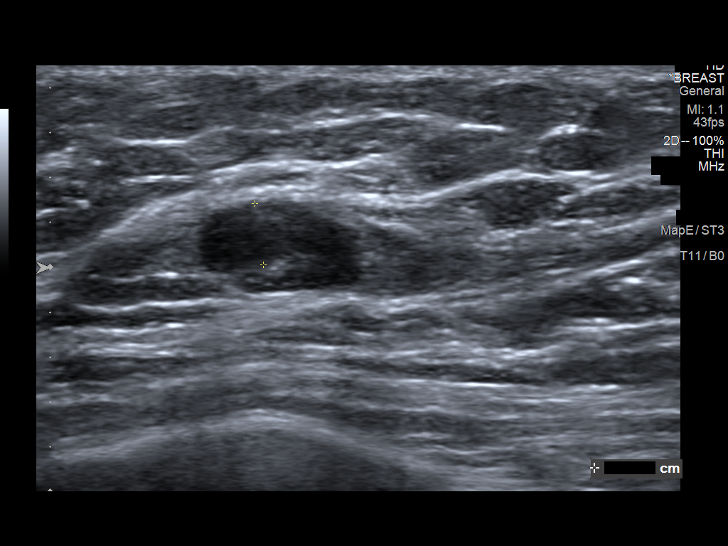
[im 3/11]
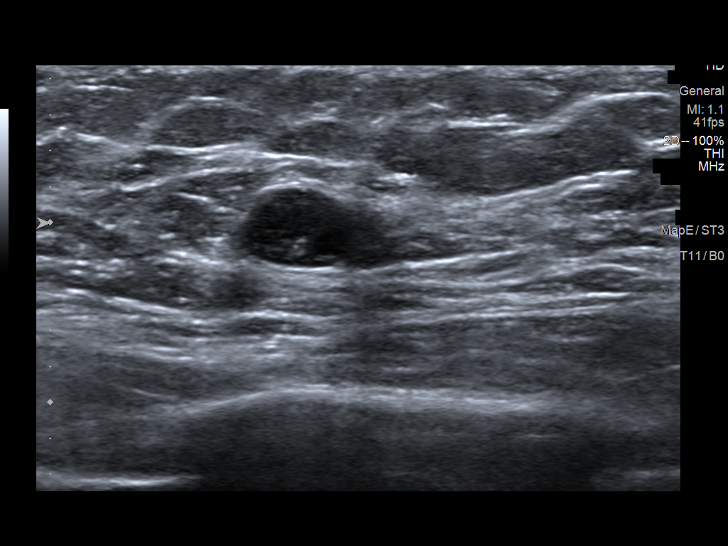
[im 4/11]
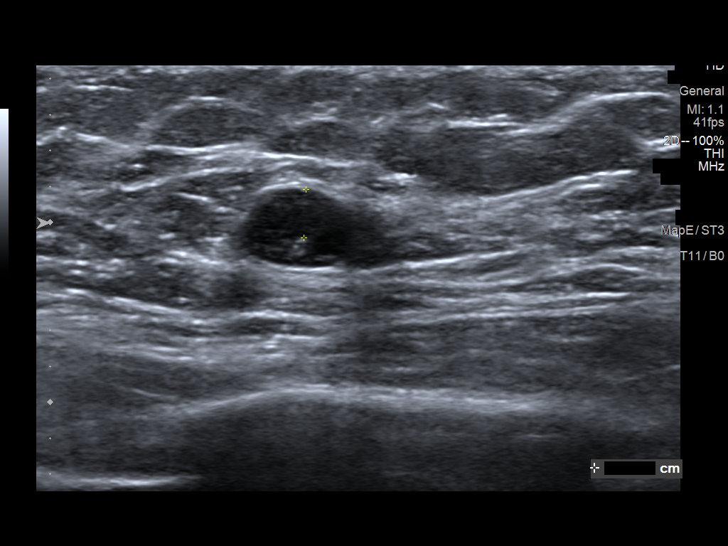
[im 5/11]
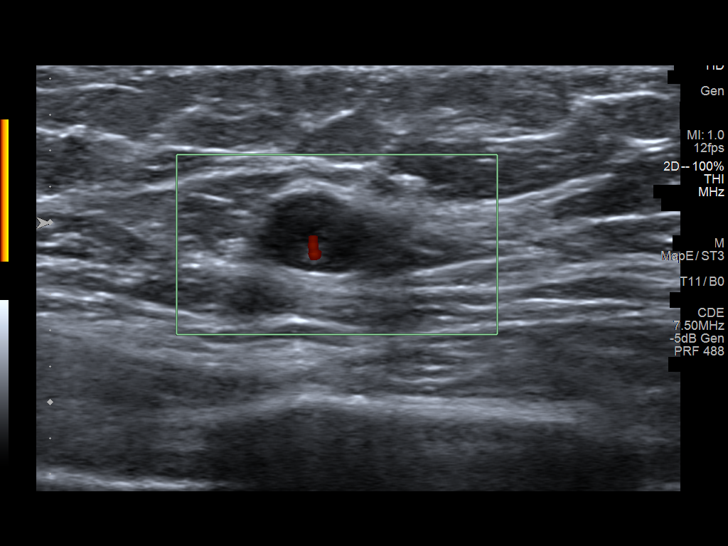
[im 6/11]
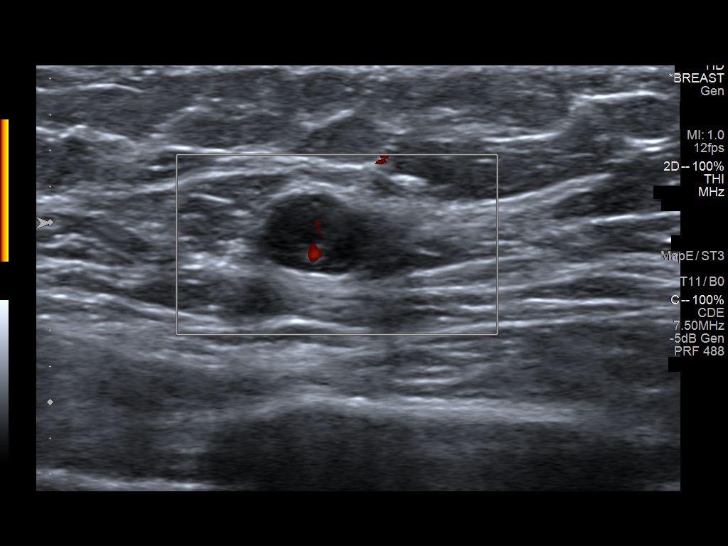
[im 7/11]
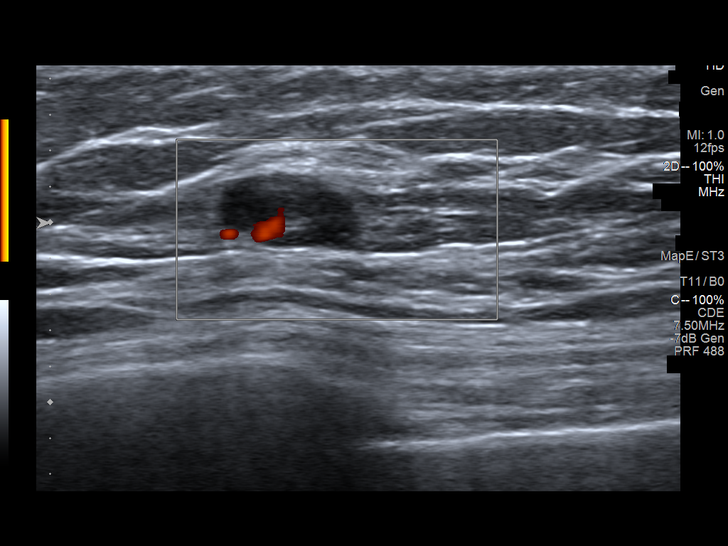
[im 8/11]
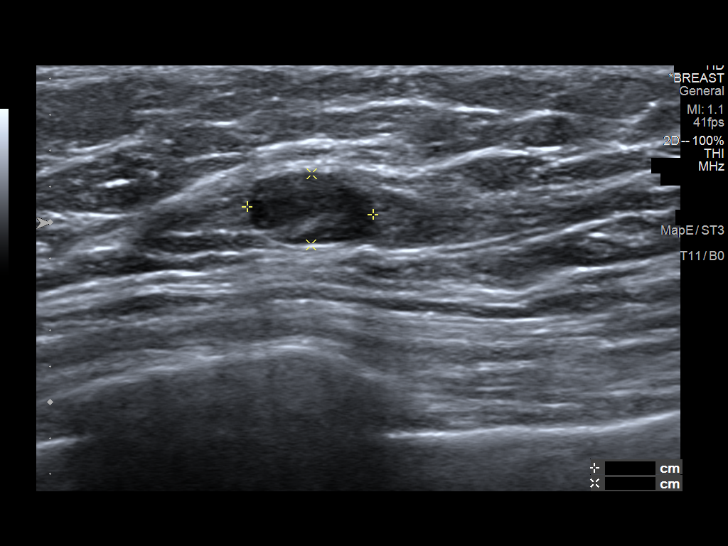
[im 9/11]
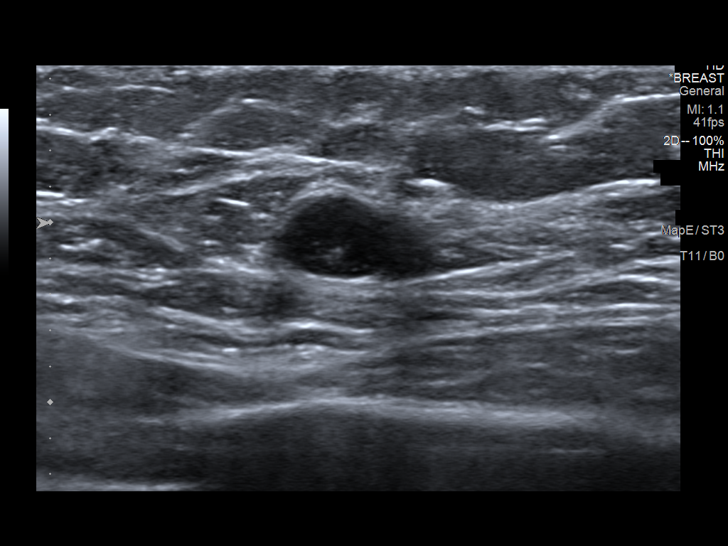
[im 10/11]
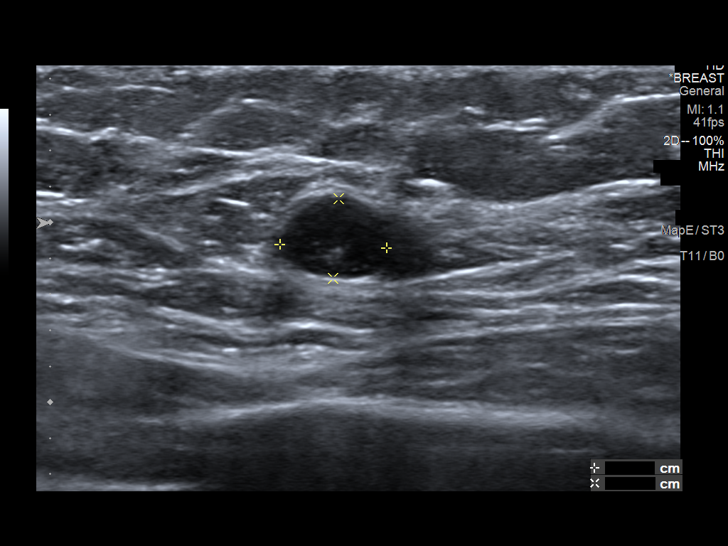
[im 11/11]
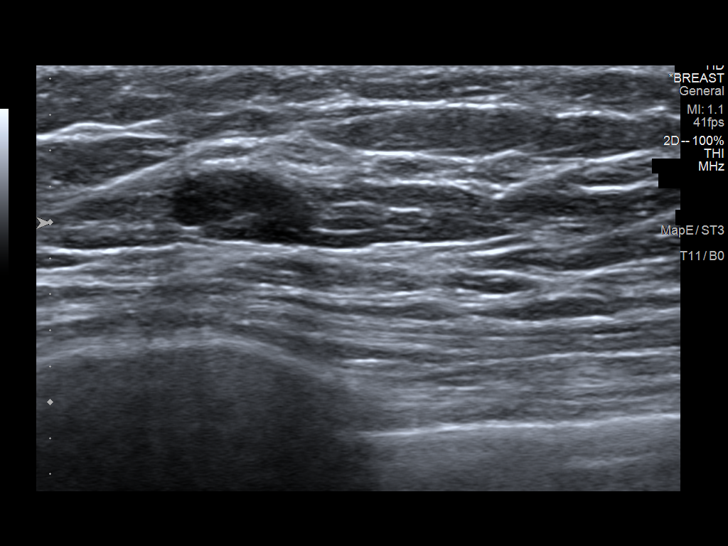

[11 of 11 positions shown; findings below may reference images not displayed]

ACR Breast Density Category c: The breast tissue is heterogeneously
dense, which may obscure small masses.
FINDINGS: XCCL and spot compression MLO views of the right breast with
tomosynthesis were performed. On the spot-compression MLO view, the
possible mass seen on screening mammogram is again noted and
partially imaged. The imaged portion of the mass demonstrates
circumscribed margins.

Mammographic images were processed with CAD.

On physical exam, no discrete mass is felt within the with area of
concern within the upper, outer right breast.

Targeted ultrasound is performed, showing a normal appearing
intramammary lymph node at 10 o'clock, 8 cm from the nipple
measuring 7 x 6 x 4 mm. The cortex measures up to 3 mm in size. This
lymph node is thought to correspond to the mammographic finding. No
suspicious cystic or solid sonographic finding is seen within this
area. This lymph node may have been present previously but not
included on prior exams due to differences in patient positioning.
IMPRESSION: Probably benign right intramammary lymph node.

RECOMMENDATION:
Right breast ultrasound in 6 months.

I have discussed the findings and recommendations with the patient.
Results were also provided in writing at the conclusion of the
visit. If applicable, a reminder letter will be sent to the patient
regarding the next appointment.

BI-RADS CATEGORY  3: Probably benign.

## 2014-12-09 ENCOUNTER — Other Ambulatory Visit: Payer: Self-pay | Admitting: Obstetrics & Gynecology

## 2014-12-09 DIAGNOSIS — N6310 Unspecified lump in the right breast, unspecified quadrant: Secondary | ICD-10-CM

## 2014-12-19 ENCOUNTER — Ambulatory Visit
Admission: RE | Admit: 2014-12-19 | Discharge: 2014-12-19 | Disposition: A | Discharge status: Home / Self Care | Payer: PRIVATE HEALTH INSURANCE | Source: Ambulatory Visit | Attending: Obstetrics & Gynecology | Admitting: Obstetrics & Gynecology

## 2014-12-19 DIAGNOSIS — N6310 Unspecified lump in the right breast, unspecified quadrant: Secondary | ICD-10-CM

## 2014-12-19 IMAGING — MG MM DIGITAL DIAGNOSTIC UNILAT R
3 series · 3 of 3 positions shown · non-contrast
Comparison: Previous exam(s).

CLINICAL DATA: Status post ultrasound-guided biopsy of a right
breast mass, upper outer quadrant/low axilla.

EXAM:
DIAGNOSTIC RIGHT MAMMOGRAM POST ULTRASOUND BIOPSY

[R LM]
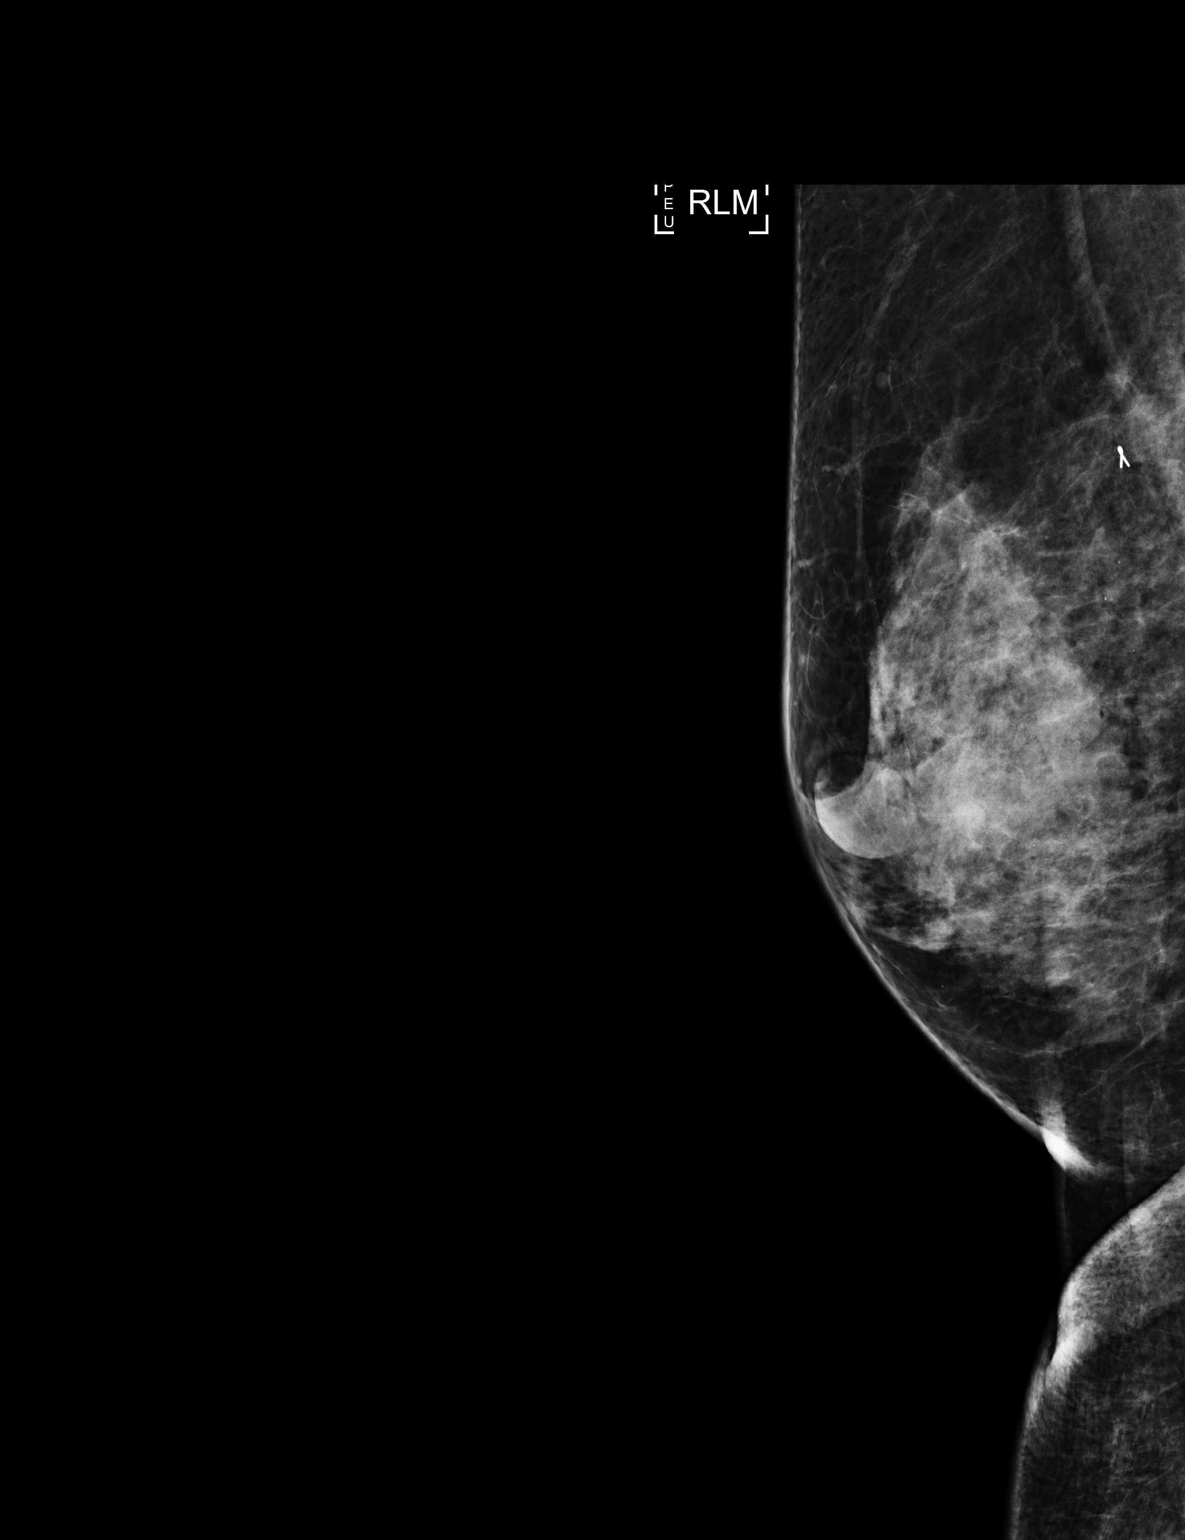

[R ML]
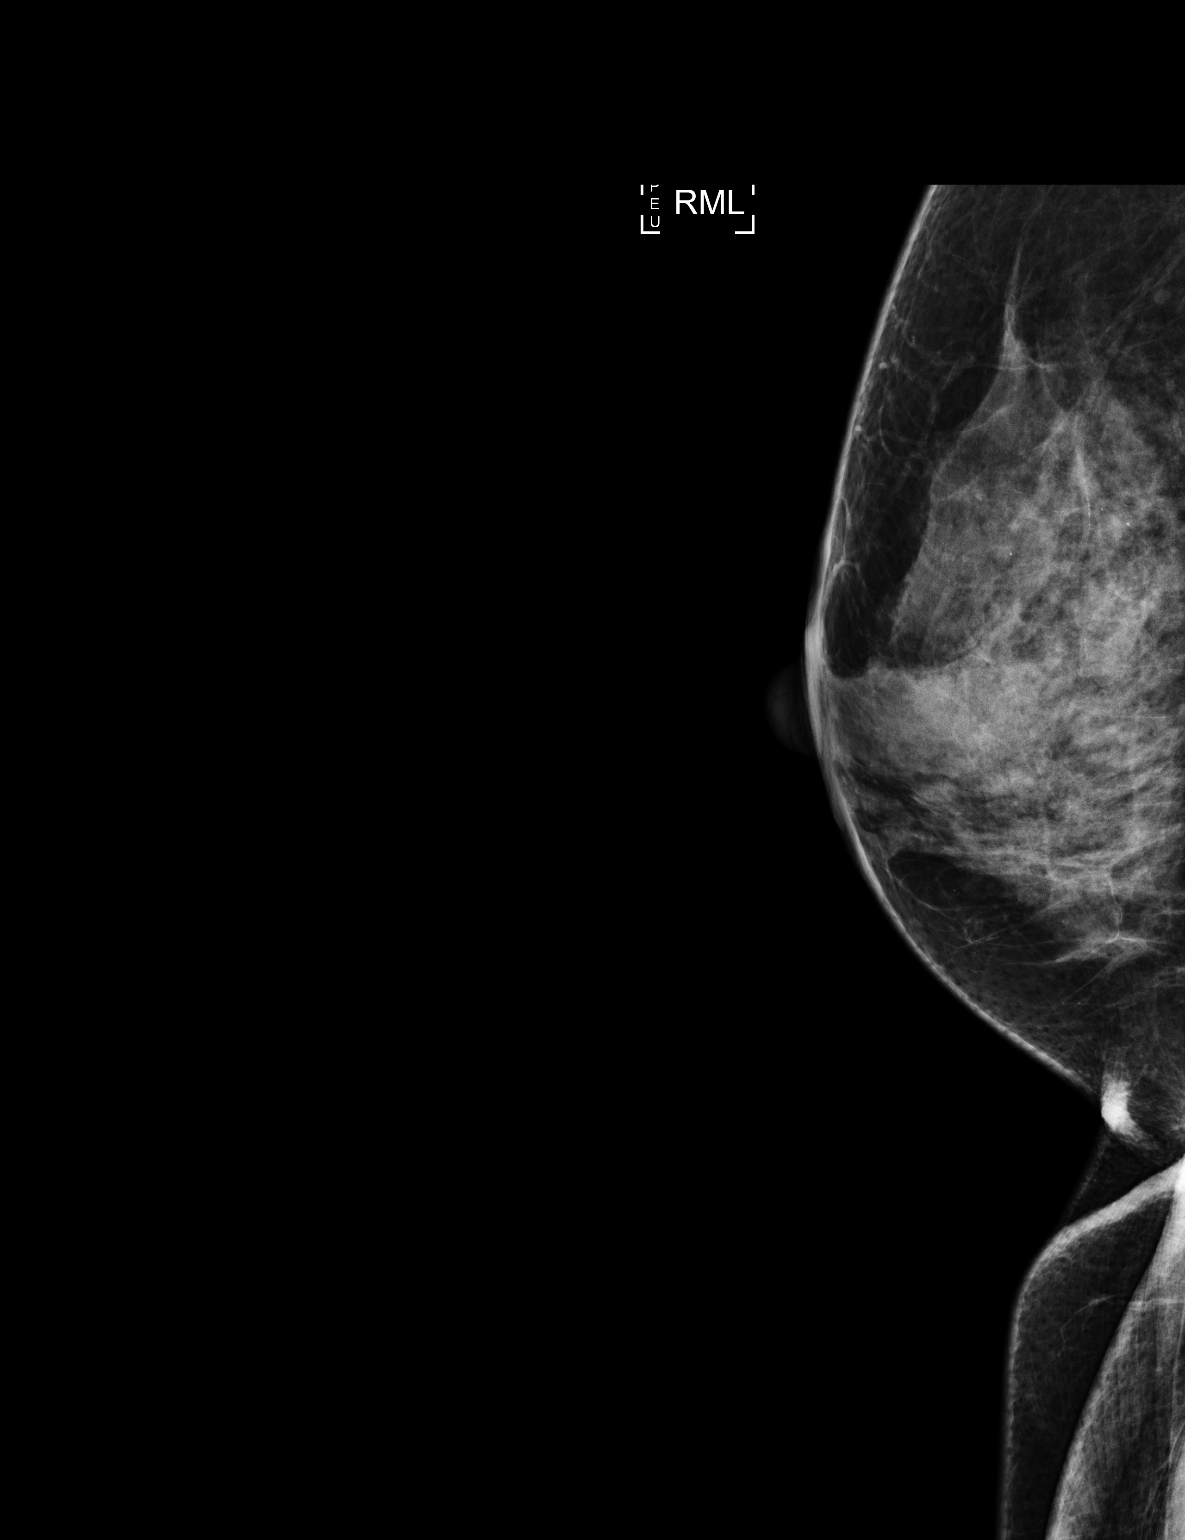

[R XCCL]
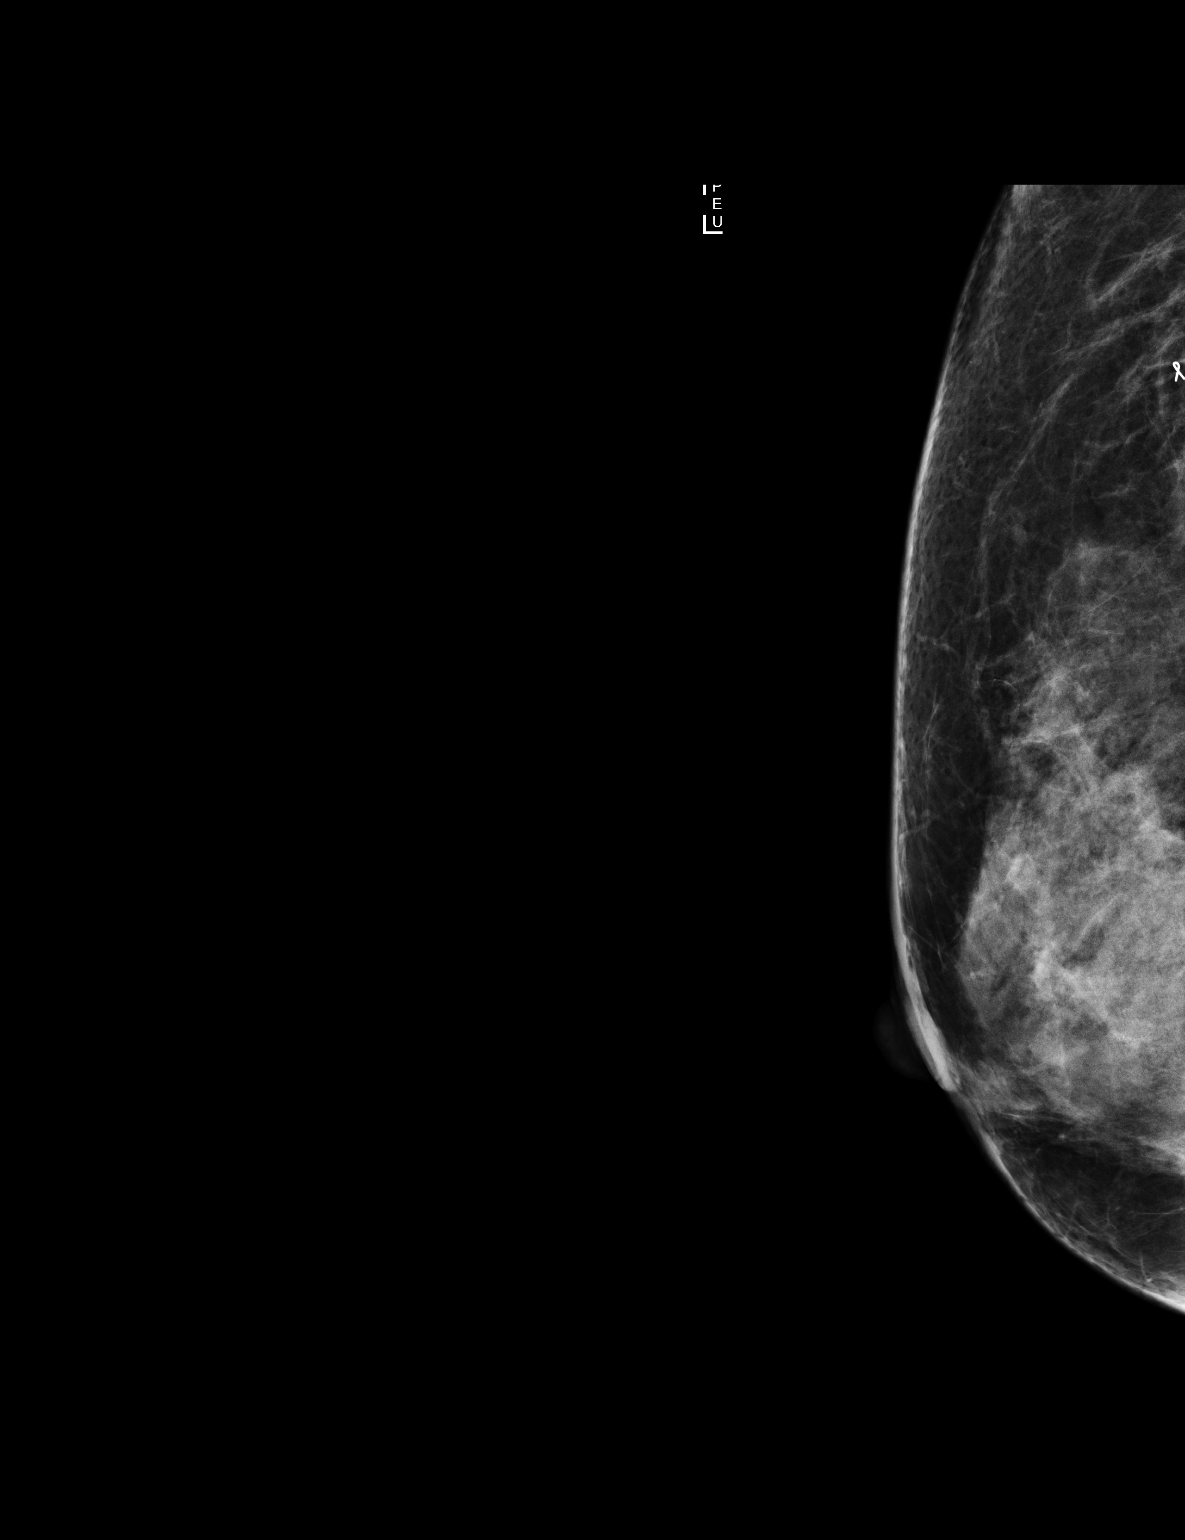

[3 of 3 positions shown; findings below may reference images not displayed]

FINDINGS: Mammographic images were obtained following ultrasound guided biopsy
of a right breast mass located at the 10 o'clock axis, 8 cm from the
nipple. This post procedure film shows the biopsy site marker to be
slightly anterior to the biopsied mass.
IMPRESSION: Post procedure mammogram for clip placement. Biopsy site marker
positioned slightly anterior to the biopsied mass.

Final Assessment: Post Procedure Mammograms for Marker Placement

## 2014-12-19 IMAGING — US US RT BREAST BX
1 series · 8 of 8 positions shown · non-contrast
Comparison: Previous exam(s), including mammograms dated [DATE]
and [DATE].

ADDENDUM:
Pathology revealed a partially sampled benign lymph node in the
right breast. This was found to be concordant by Dr. GEORGES EMMANUEL.
Pathology was discussed with the patient by telephone. She reported
doing well after the biopsy. Post biopsy instructions and care were
reviewed and her questions were answered. She was encouraged to call
She was asked to return for right diagnostic mammography and
ultrasound in 6 months.

Pathology results reported by GEORGES EMMANUEL RN, BSN on [DATE].
CLINICAL DATA: Patient with a right breast mass, upper outer
quadrant, 10 o'clock axis, presents for ultrasound-guided core
biopsy.
EXAM:
ULTRASOUND GUIDED RIGHT BREAST CORE NEEDLE BIOPSY

[Series 1: us right breast bx · 0.06mm/px · 8 of 8 slices shown]
[im 1/8]
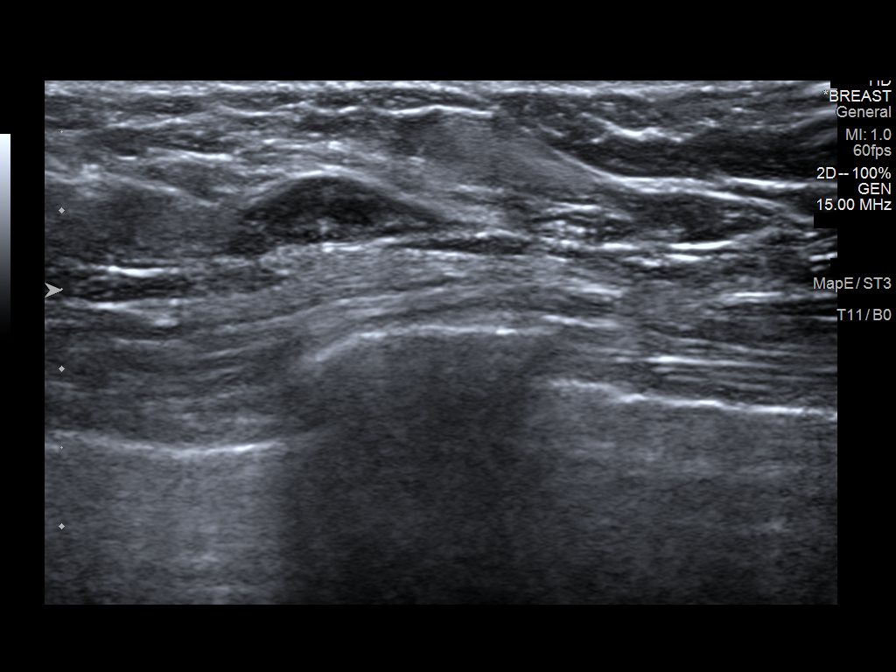
[im 2/8]
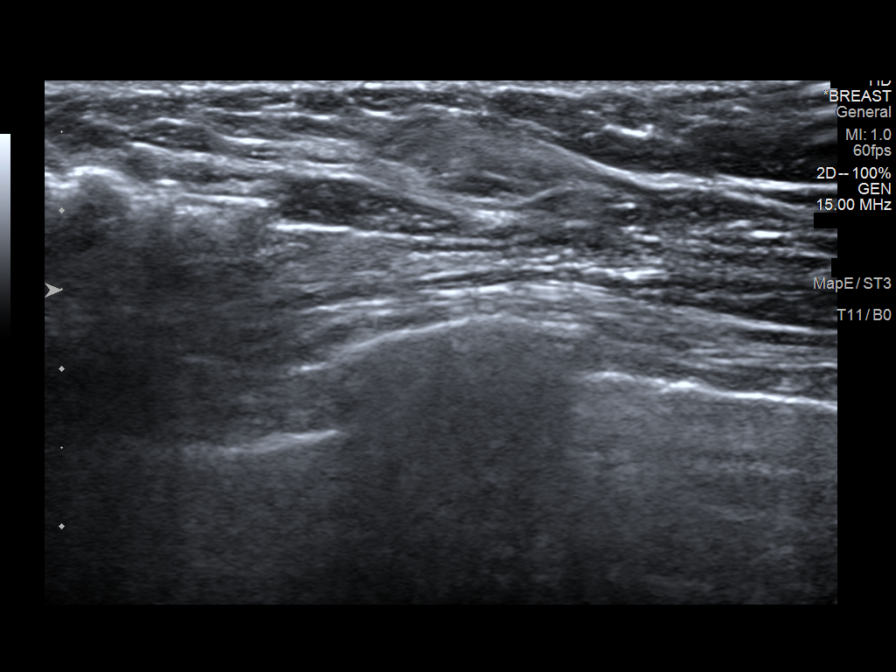
[im 3/8]
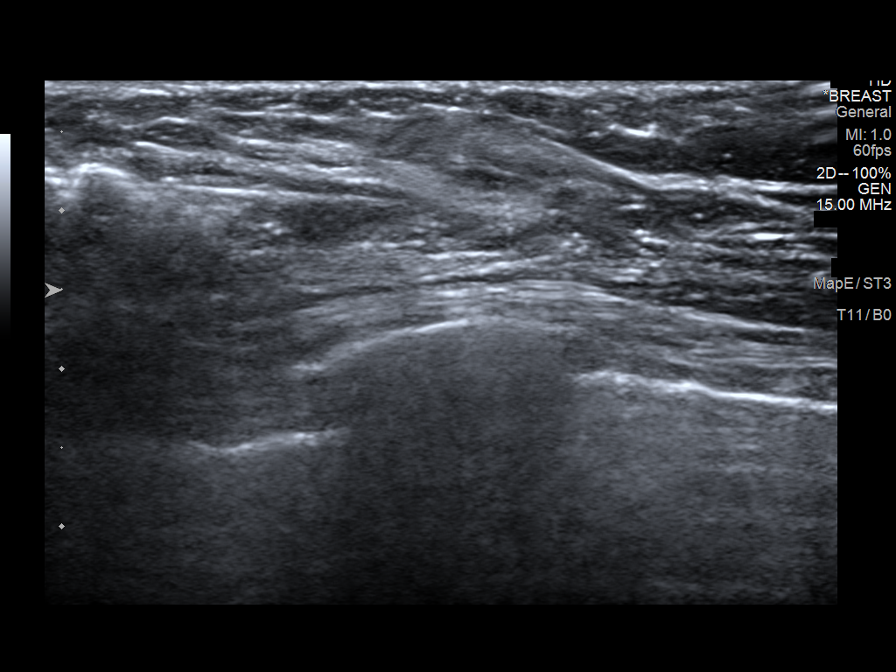
[im 4/8]
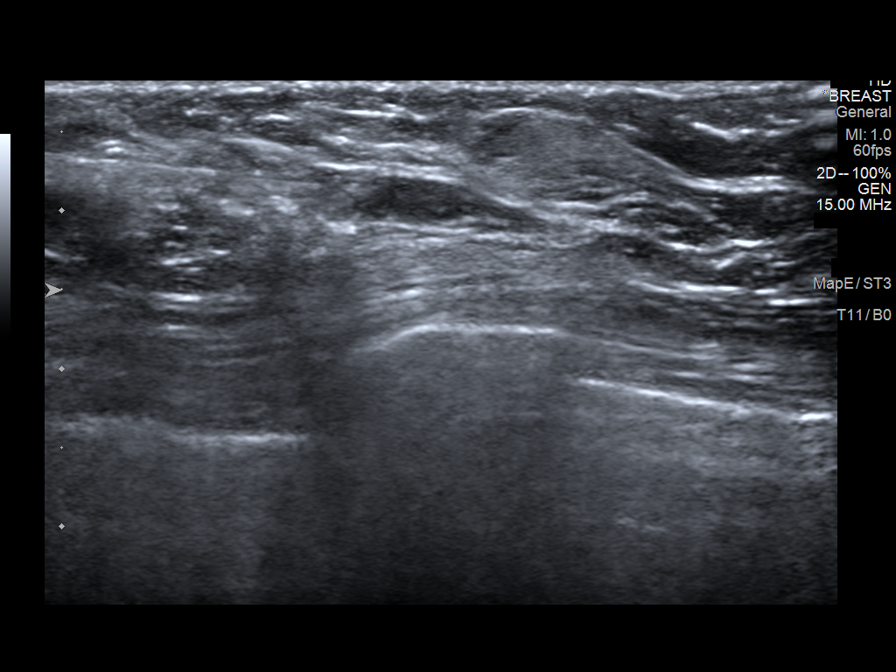
[im 5/8]
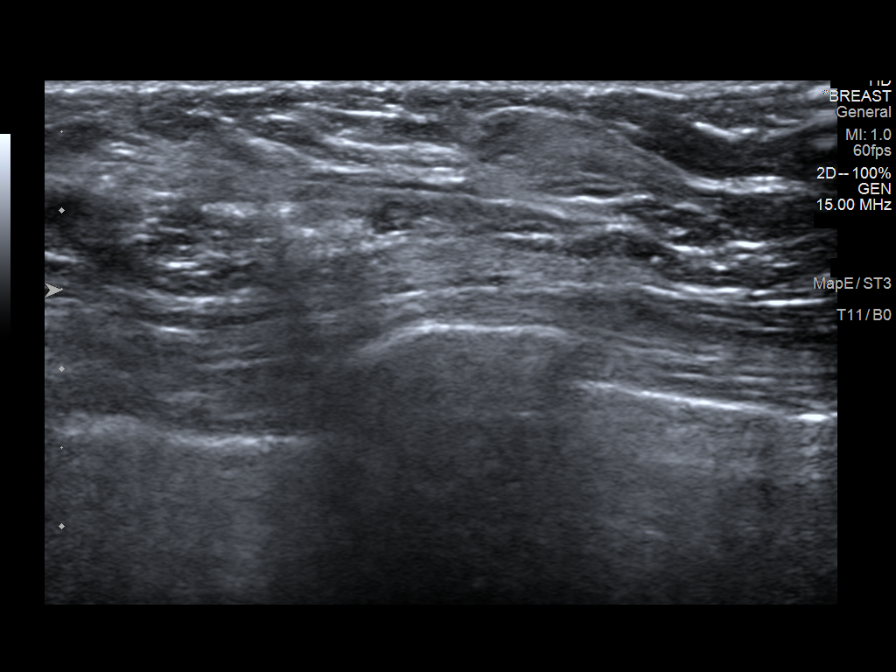
[im 6/8]
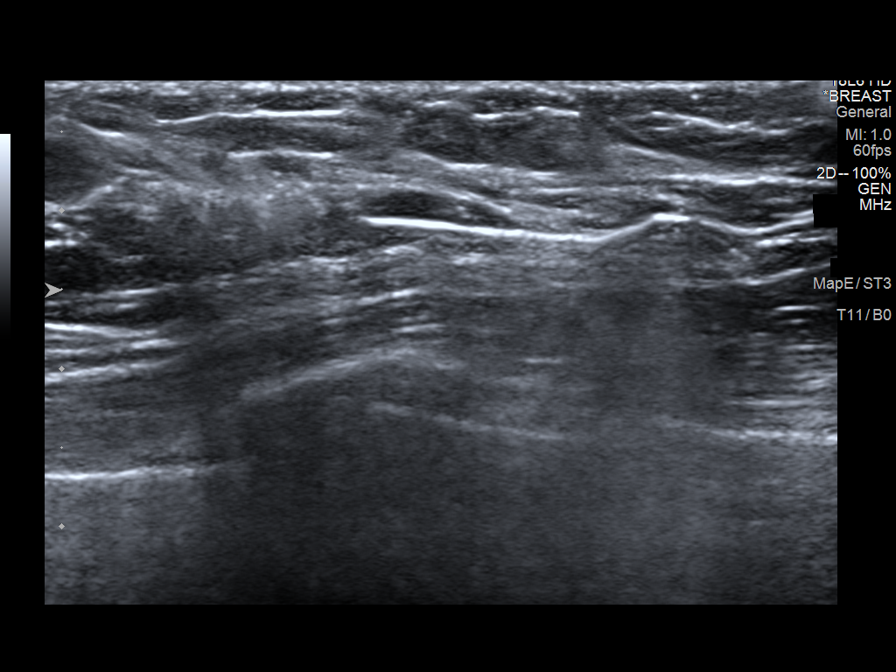
[im 7/8]
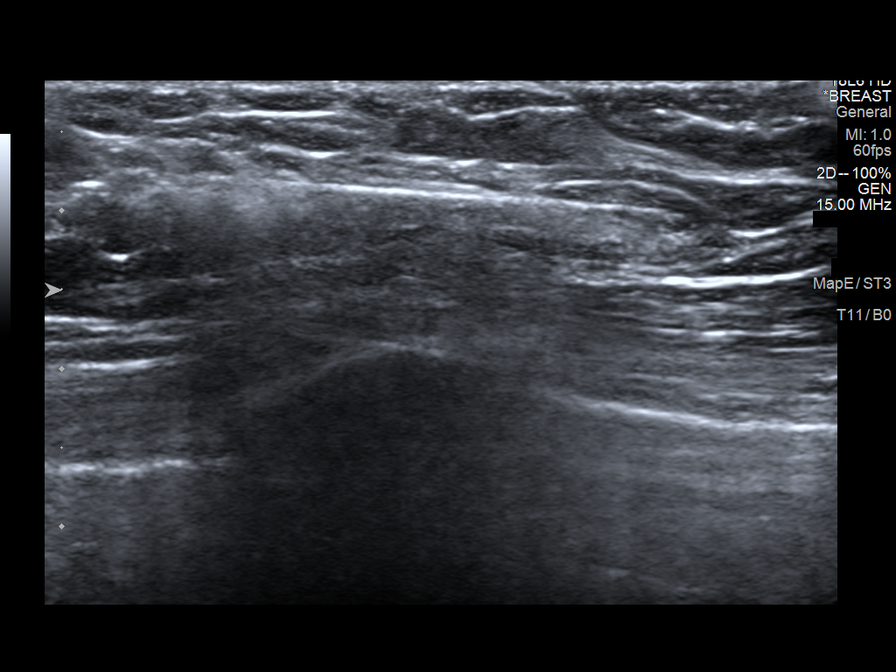
[im 8/8]
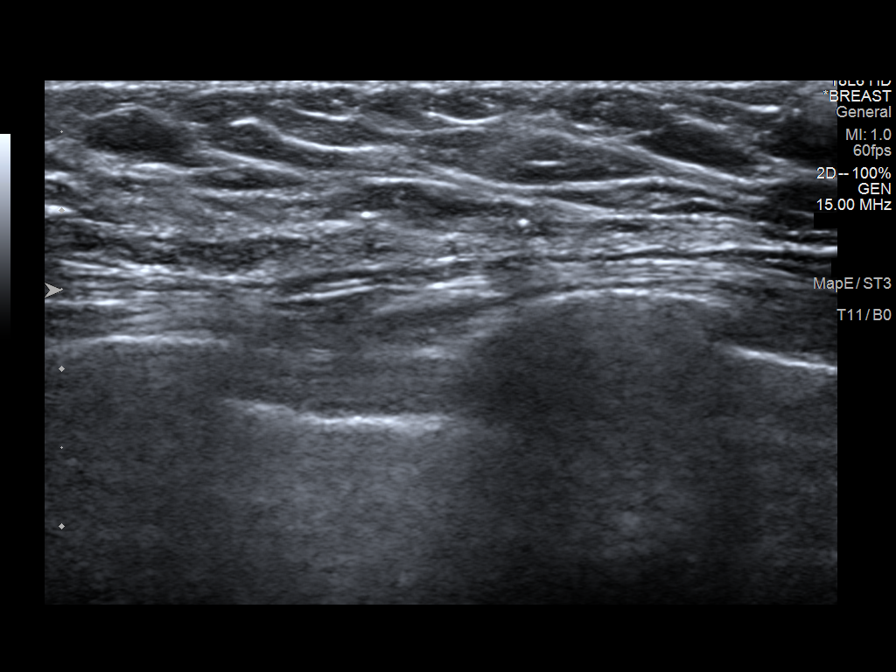

[8 of 8 positions shown; findings below may reference images not displayed]

PROCEDURE:
I met with the patient and we discussed the procedure of
ultrasound-guided biopsy, including benefits and alternatives. We
discussed the high likelihood of a successful procedure. We
discussed the risks of the procedure including infection, bleeding,
tissue injury, clip migration, and inadequate sampling. Informed
written consent was given. The usual time-out protocol was performed
immediately prior to the procedure.

Using sterile technique and 2% Lidocaine as local anesthetic, under
direct ultrasound visualization, a 12 gauge vacuum-assisted device
was used to perform biopsy of the right breast mass, 10 o'clock
axis,using a lateral approach. At the conclusion of the procedure, a
ribbon tissue marker clip was deployed into the biopsy cavity. Based
on the subsequent postprocedure mammogram, the ribbon biopsy clip is
positioned slightly anterior to the biopsied mass. Follow-up 2-view
mammogram was performed and dictated separately.
IMPRESSION: Ultrasound-guided biopsy of the right breast mass which is favored
to be a benign intramammary/low axilla lymph node. No apparent
complications.

## 2014-12-22 ENCOUNTER — Other Ambulatory Visit: Payer: PRIVATE HEALTH INSURANCE

## 2015-06-30 ENCOUNTER — Other Ambulatory Visit: Payer: Self-pay | Admitting: Obstetrics & Gynecology

## 2015-06-30 DIAGNOSIS — N6310 Unspecified lump in the right breast, unspecified quadrant: Secondary | ICD-10-CM

## 2015-07-05 ENCOUNTER — Ambulatory Visit
Admission: RE | Admit: 2015-07-05 | Discharge: 2015-07-05 | Disposition: A | Discharge status: Home / Self Care | Payer: PRIVATE HEALTH INSURANCE | Source: Ambulatory Visit | Attending: Obstetrics & Gynecology | Admitting: Obstetrics & Gynecology

## 2015-07-05 DIAGNOSIS — N6310 Unspecified lump in the right breast, unspecified quadrant: Secondary | ICD-10-CM

## 2015-07-05 IMAGING — US US BREAST LTD UNI RIGHT INC AXILLA
1 series · 4 of 4 positions shown · non-contrast
Comparison: Previous exam(s).

CLINICAL DATA: Six-month follow-up status post benign biopsy of a
mass within the upper right breast (pathology diagnosis of benign
lymph node).

EXAM:
DIGITAL DIAGNOSTIC RIGHT MAMMOGRAM WITH 3D TOMOSYNTHESIS WITH CAD
ULTRASOUND RIGHT BREAST

[Series 1: advbreast · 4 of 4 slices shown]
[im 1/4]
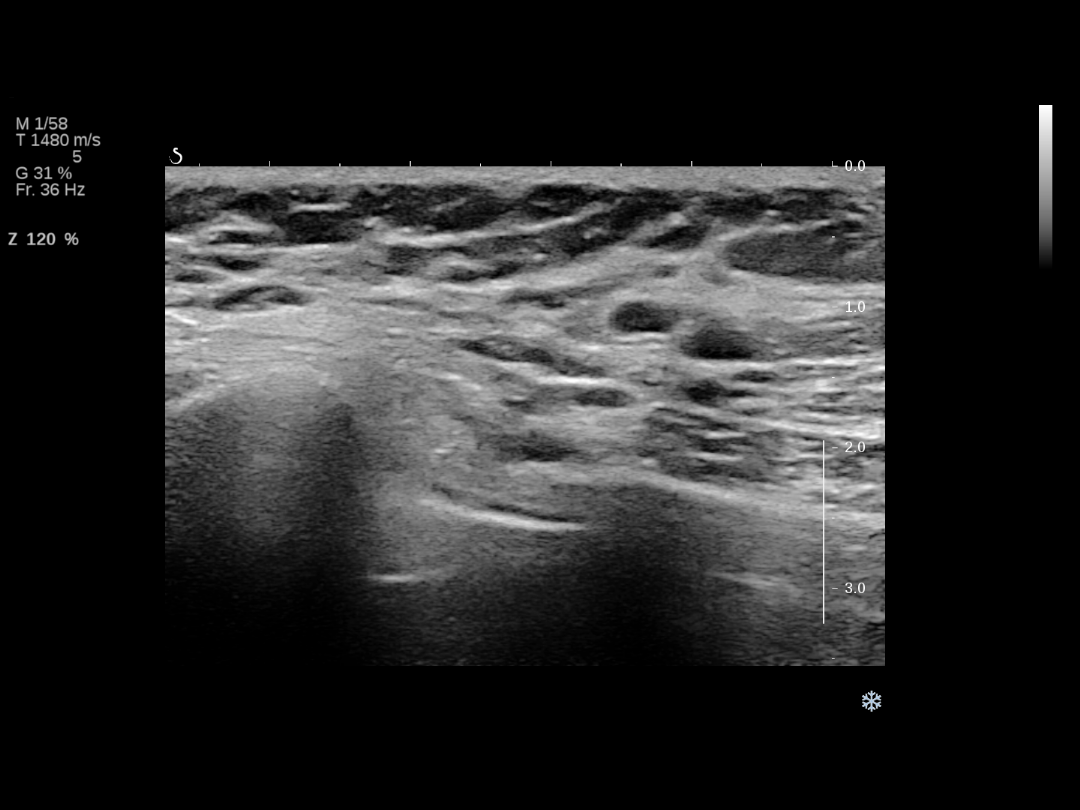
[im 2/4]
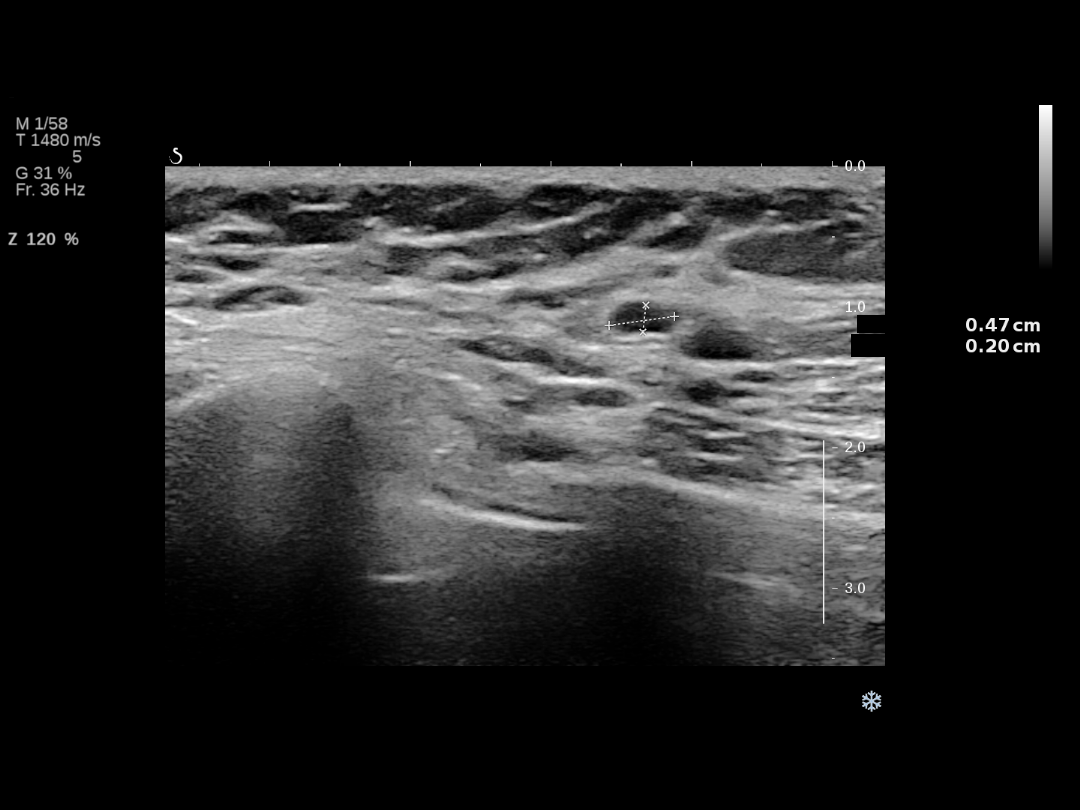
[im 3/4]
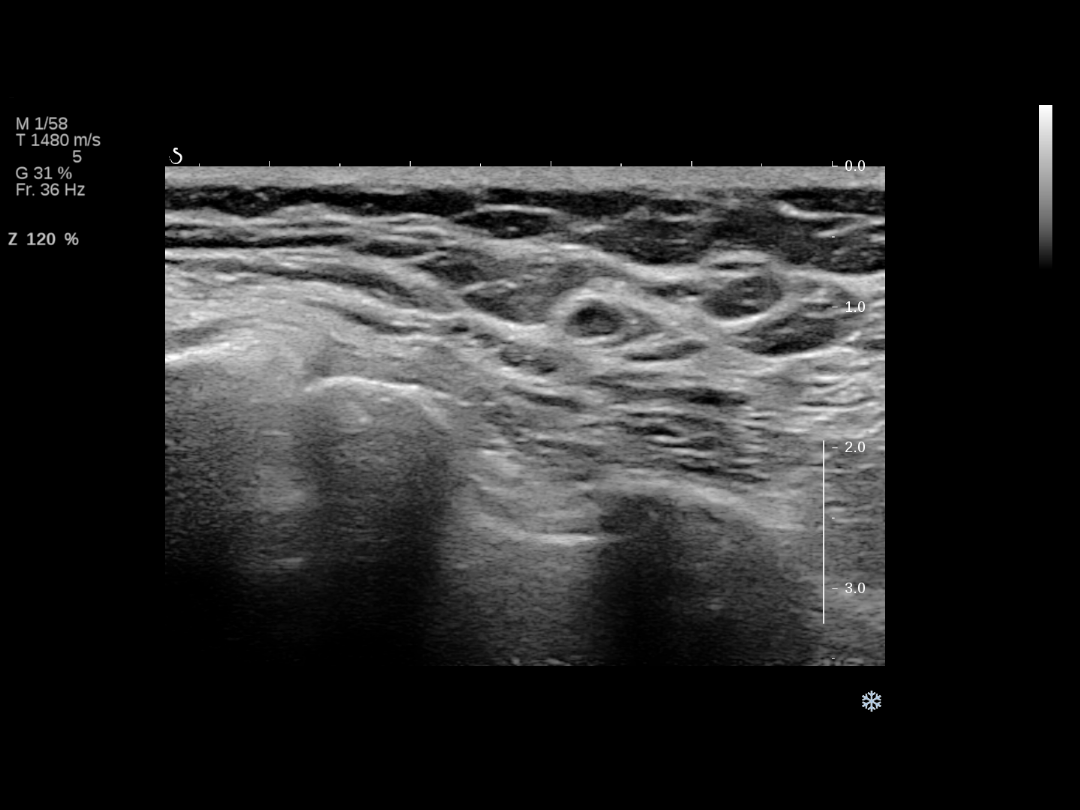
[im 4/4]
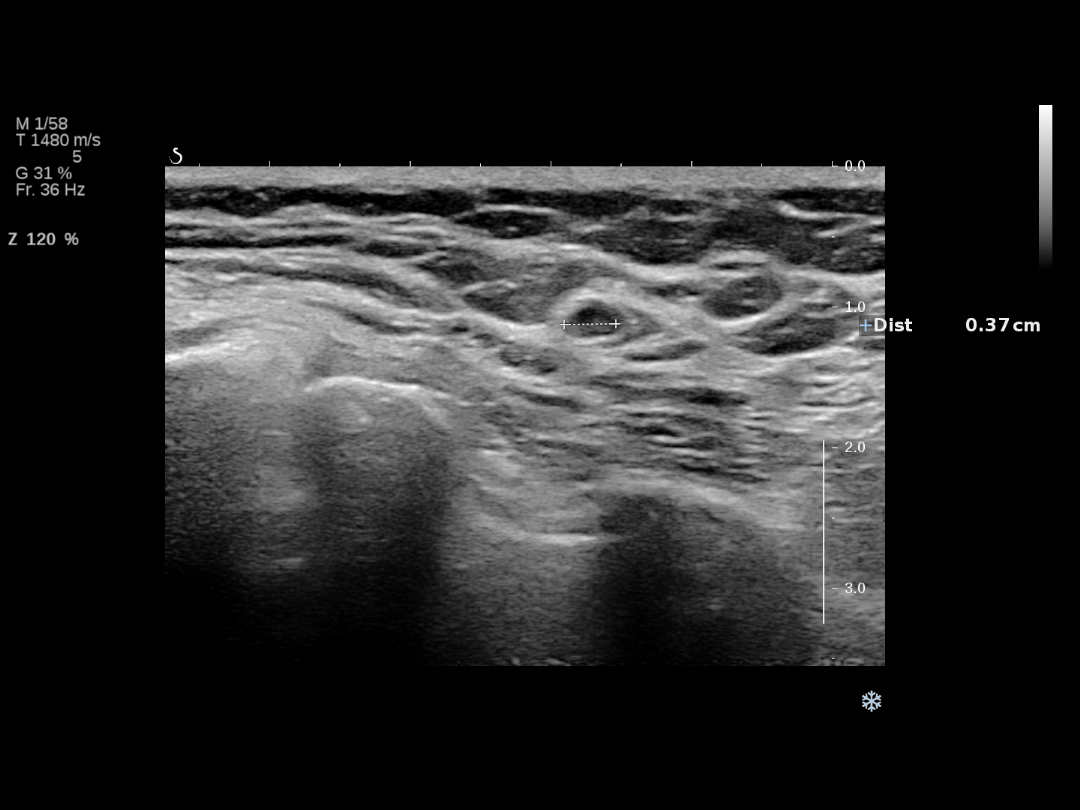

[4 of 4 positions shown; findings below may reference images not displayed]

ACR Breast Density Category c: The breast tissue is heterogeneously
dense, which may obscure small masses.
FINDINGS: Right breast CC and MLO projections were obtained today with 3D
tomosynthesis. Additional tangential view, with spot compression, of
the right breast was obtained, corresponding to the site of previous
biopsy.

There is a stable mass within the upper right breast, at far
posterior depth, now with associated biopsy clip.

There are no new dominant masses, suspicious calcifications or
secondary signs of malignancy within the right breast.

Mammographic images were processed with CAD.

Targeted ultrasound is performed, evaluating the entire upper outer
quadrant of the right breast with particular attention to the 10
o'clock axis corresponding to the site of previous biopsy. The lymph
node at the 10 o'clock axis, 8 cm from the nipple, is slightly
smaller measuring 5 mm greatest dimension. No suspicious solid or
cystic masses are identified within the upper-outer quadrant.
IMPRESSION: Benign lymph node within the upper outer quadrant of the right
breast, at posterior depth, with associated biopsy clip.

No evidence of malignancy within the right breast.

Patient may return to routine annual bilateral screening mammogram
schedule. Next bilateral screening mammogram is due in 6 months in
conjunction with patient's routine left breast screening mammogram
schedule.

RECOMMENDATION:
Bilateral screening mammogram in 6 months.

I have discussed the findings and recommendations with the patient.
Results were also provided in writing at the conclusion of the
visit. If applicable, a reminder letter will be sent to the patient
regarding the next appointment.

BI-RADS CATEGORY  2: Benign.

## 2015-07-05 IMAGING — MG MM DIAG BREAST TOMO UNI RIGHT
5 series · 6 of 13 positions shown · non-contrast
Comparison: Previous exam(s).

CLINICAL DATA: Six-month follow-up status post benign biopsy of a
mass within the upper right breast (pathology diagnosis of benign
lymph node).

EXAM:
DIGITAL DIAGNOSTIC RIGHT MAMMOGRAM WITH 3D TOMOSYNTHESIS WITH CAD
ULTRASOUND RIGHT BREAST

[R MLO (1 of 2)]
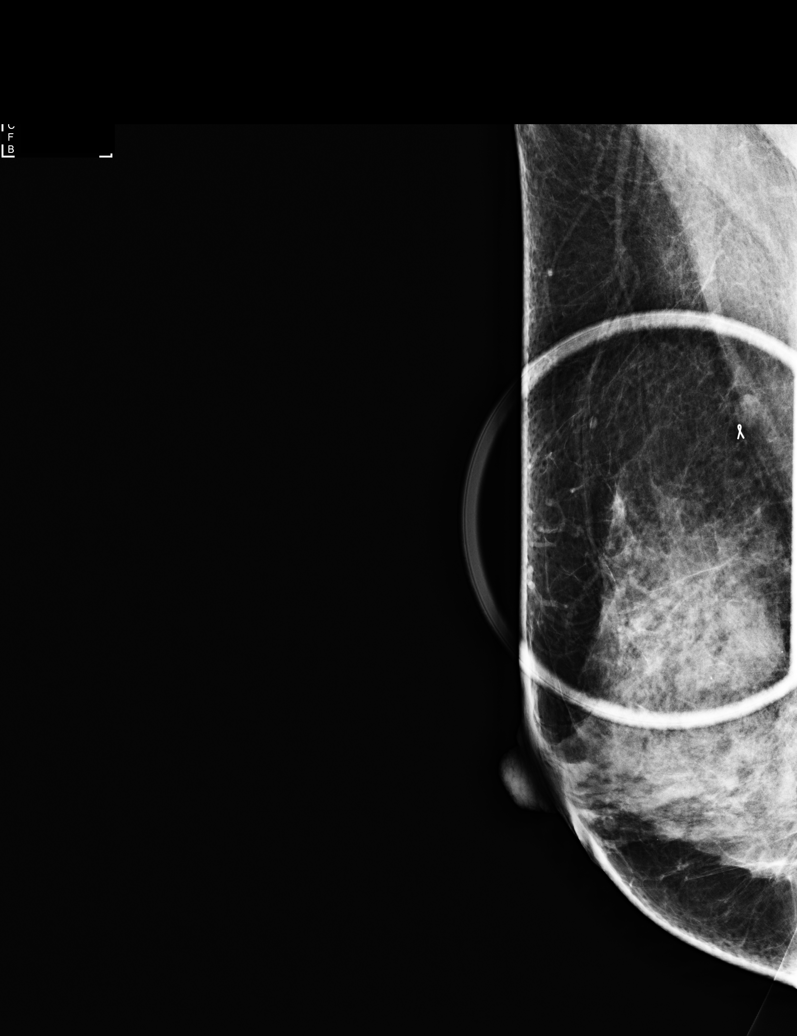

[R CC]
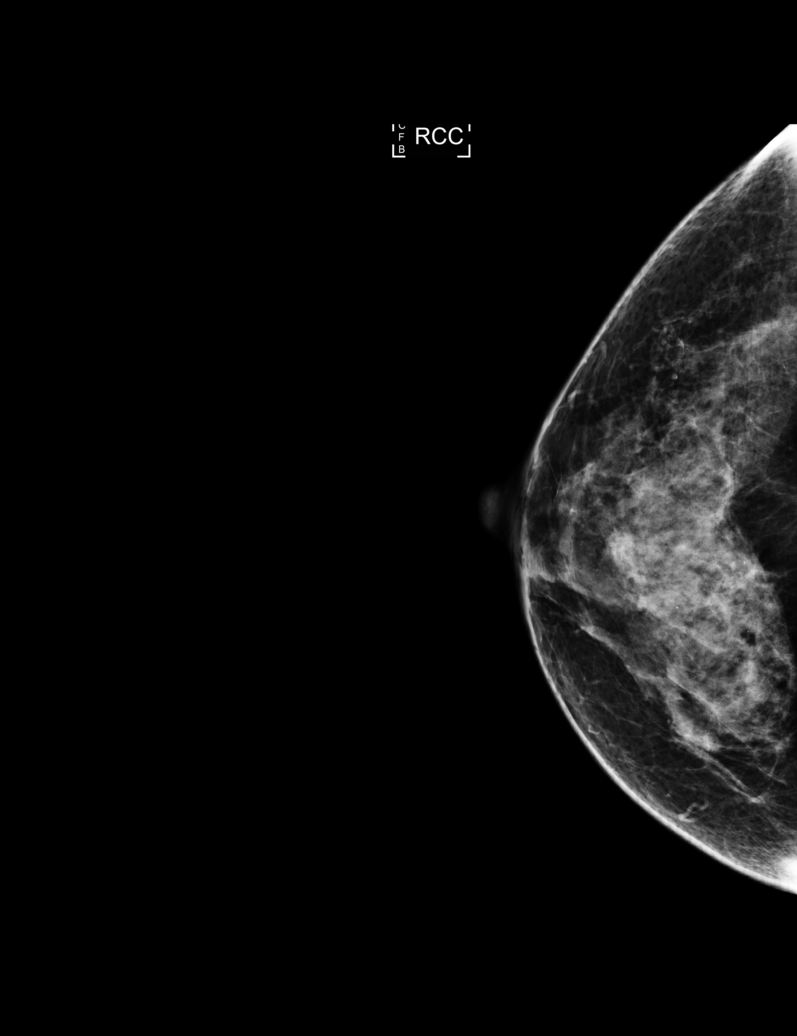

[R MLO (2 of 2)]
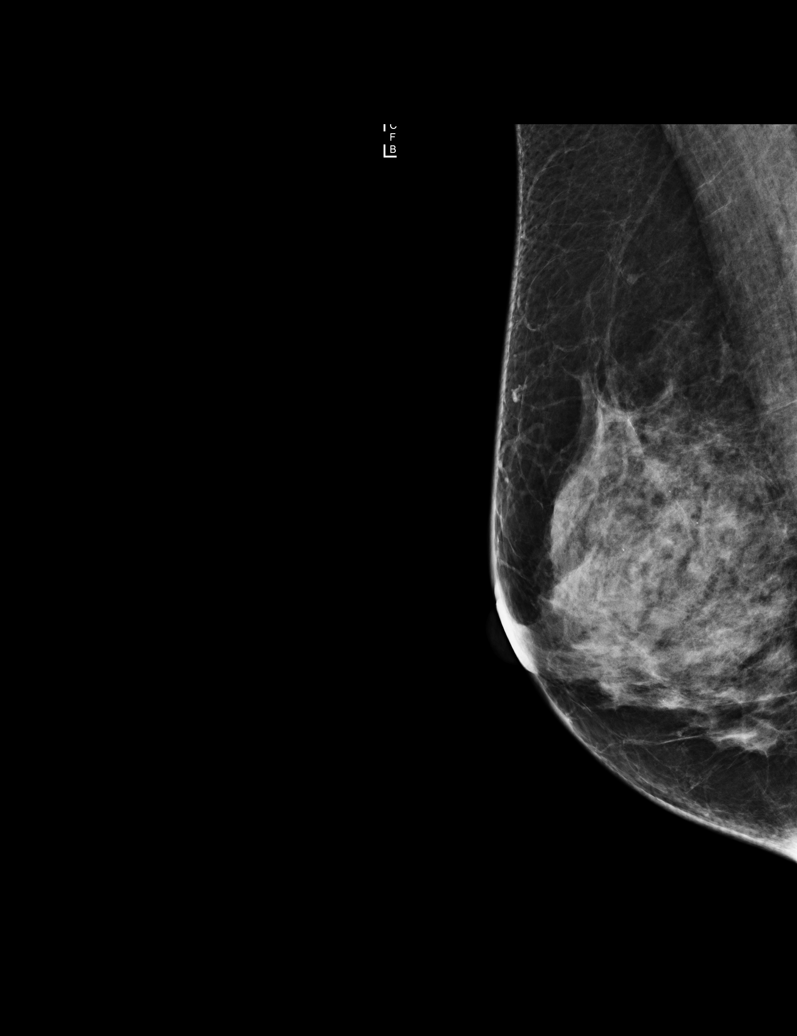

[R MLO tomo · 2 of 53 frames shown]
[frame 18/53]
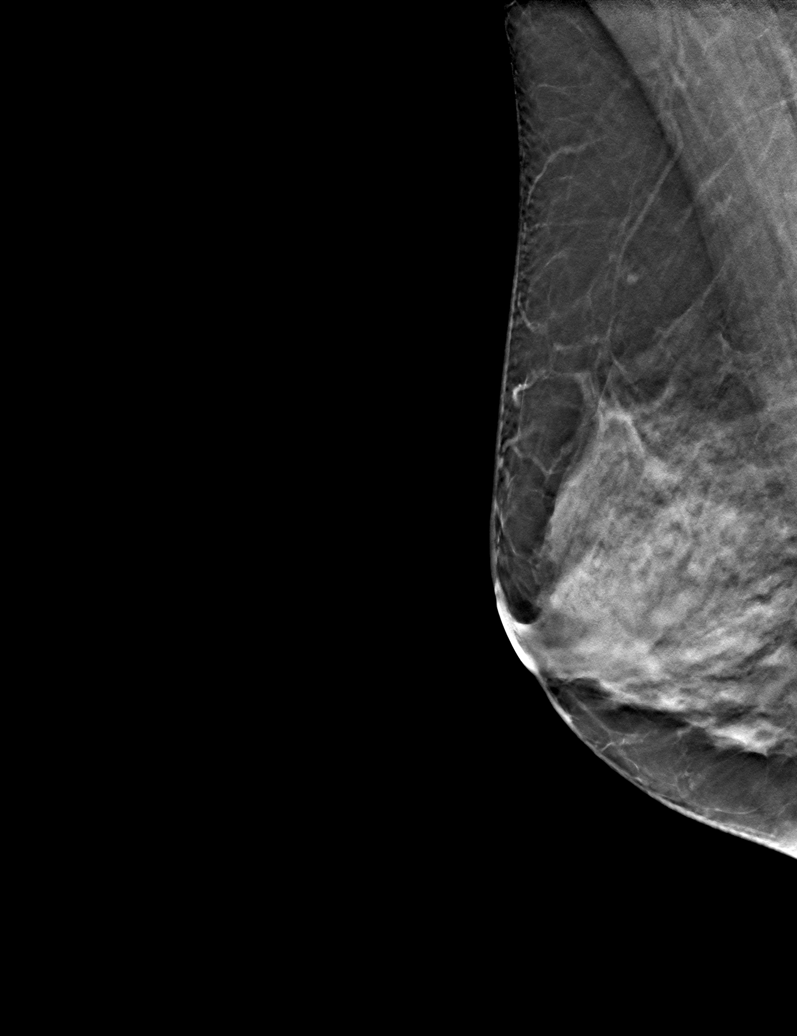
[frame 27/53]
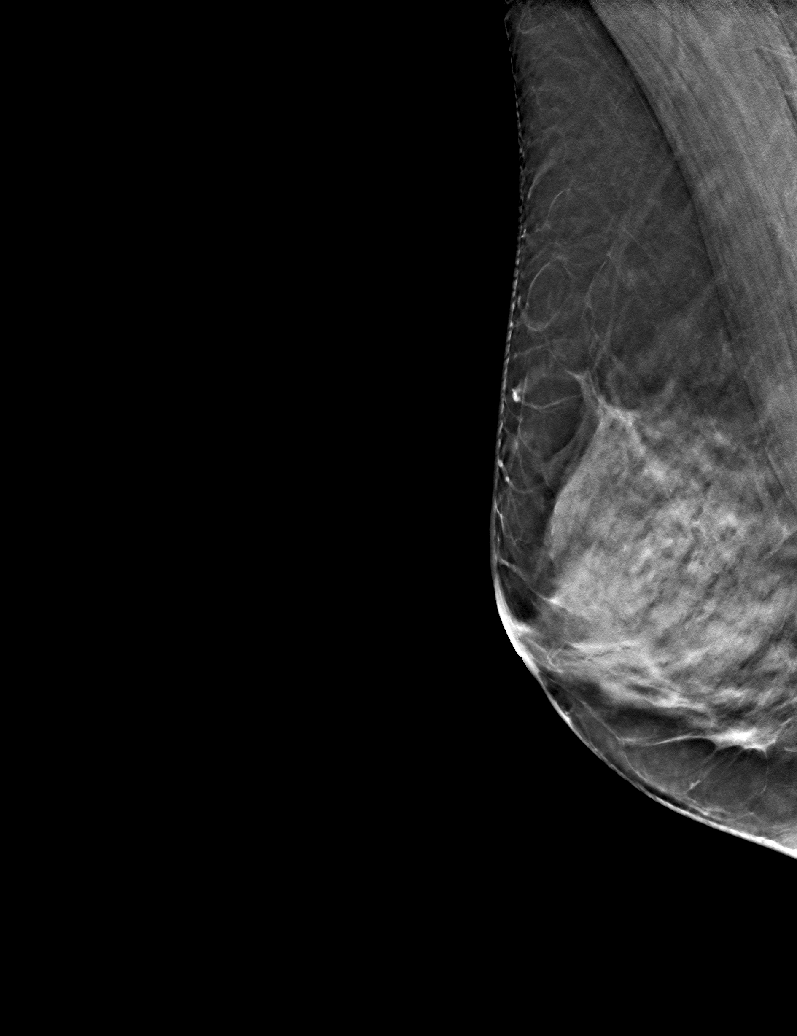

[R CC tomo · tomo slice 33/66.0]
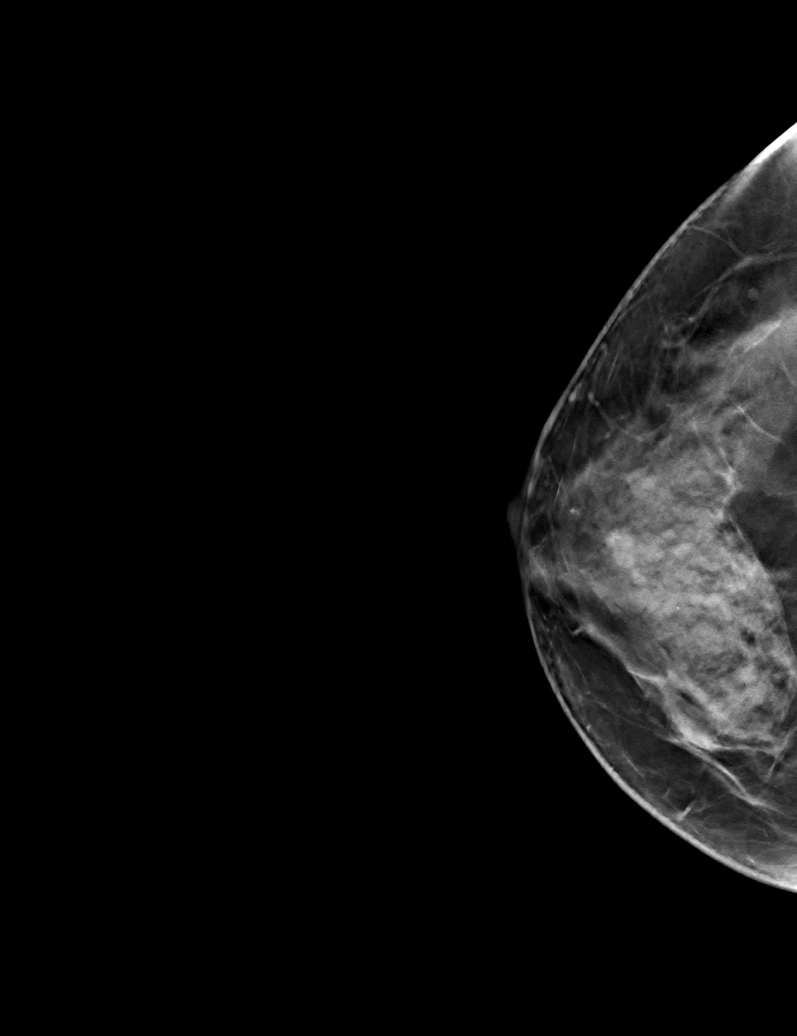

[6 of 13 positions shown; findings below may reference images not displayed]

ACR Breast Density Category c: The breast tissue is heterogeneously
dense, which may obscure small masses.
FINDINGS: Right breast CC and MLO projections were obtained today with 3D
tomosynthesis. Additional tangential view, with spot compression, of
the right breast was obtained, corresponding to the site of previous
biopsy.

There is a stable mass within the upper right breast, at far
posterior depth, now with associated biopsy clip.

There are no new dominant masses, suspicious calcifications or
secondary signs of malignancy within the right breast.

Mammographic images were processed with CAD.

Targeted ultrasound is performed, evaluating the entire upper outer
quadrant of the right breast with particular attention to the 10
o'clock axis corresponding to the site of previous biopsy. The lymph
node at the 10 o'clock axis, 8 cm from the nipple, is slightly
smaller measuring 5 mm greatest dimension. No suspicious solid or
cystic masses are identified within the upper-outer quadrant.
IMPRESSION: Benign lymph node within the upper outer quadrant of the right
breast, at posterior depth, with associated biopsy clip.

No evidence of malignancy within the right breast.

Patient may return to routine annual bilateral screening mammogram
schedule. Next bilateral screening mammogram is due in 6 months in
conjunction with patient's routine left breast screening mammogram
schedule.

RECOMMENDATION:
Bilateral screening mammogram in 6 months.

I have discussed the findings and recommendations with the patient.
Results were also provided in writing at the conclusion of the
visit. If applicable, a reminder letter will be sent to the patient
regarding the next appointment.

BI-RADS CATEGORY  2: Benign.

## 2016-01-01 DIAGNOSIS — Z8249 Family history of ischemic heart disease and other diseases of the circulatory system: Secondary | ICD-10-CM | POA: Insufficient documentation

## 2016-01-01 DIAGNOSIS — I10 Essential (primary) hypertension: Secondary | ICD-10-CM | POA: Insufficient documentation

## 2016-02-20 ENCOUNTER — Other Ambulatory Visit: Payer: Self-pay | Admitting: Obstetrics & Gynecology

## 2016-02-20 DIAGNOSIS — Z1231 Encounter for screening mammogram for malignant neoplasm of breast: Secondary | ICD-10-CM

## 2016-03-05 ENCOUNTER — Ambulatory Visit
Admission: RE | Admit: 2016-03-05 | Discharge: 2016-03-05 | Disposition: A | Discharge status: Home / Self Care | Payer: PRIVATE HEALTH INSURANCE | Source: Ambulatory Visit | Attending: Obstetrics & Gynecology | Admitting: Obstetrics & Gynecology

## 2016-03-05 DIAGNOSIS — Z1231 Encounter for screening mammogram for malignant neoplasm of breast: Secondary | ICD-10-CM

## 2016-03-05 IMAGING — MG 2D DIGITAL SCREENING BILATERAL MAMMOGRAM WITH CAD AND ADJUNCT TO
9 of 12 series · 9 of 28 positions shown · non-contrast
Comparison: Previous exam(s).

CLINICAL DATA: Screening.

EXAM:
2D DIGITAL SCREENING BILATERAL MAMMOGRAM WITH CAD AND ADJUNCT TOMO

[L MLO synth-2D]
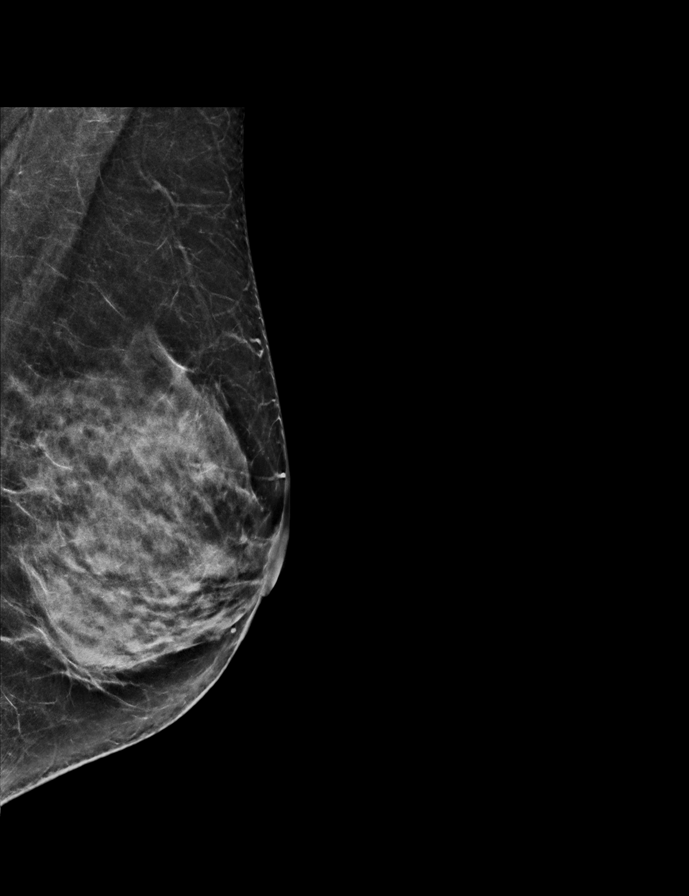

[R CC]
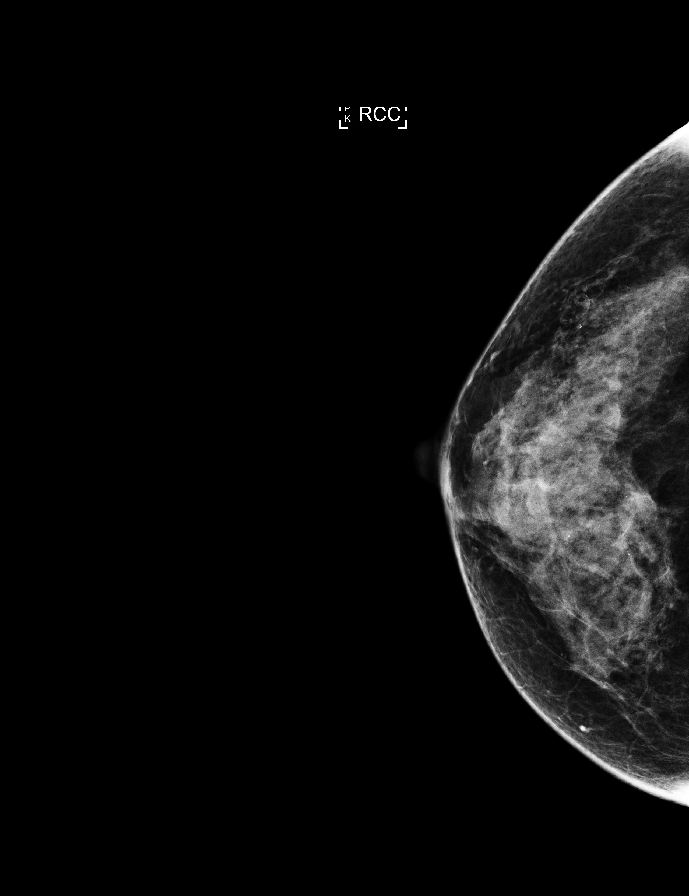

[R MLO synth-2D]
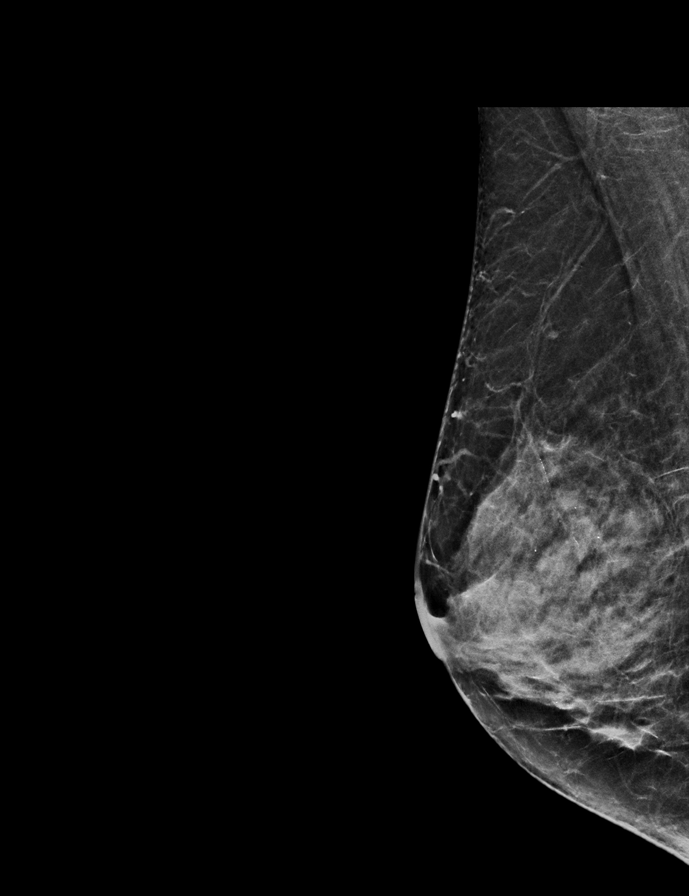

[L MLO]
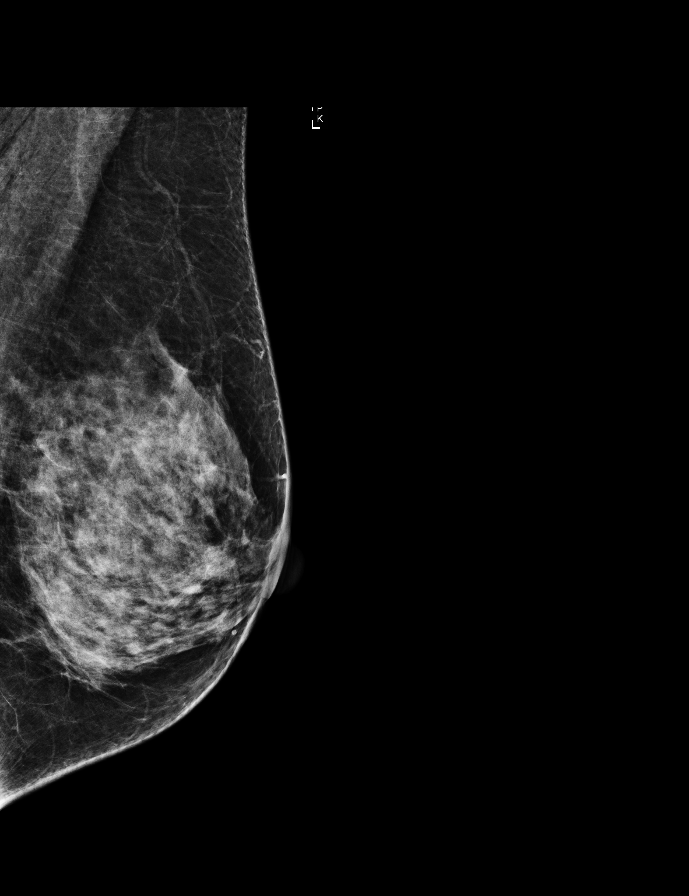

[R MLO]
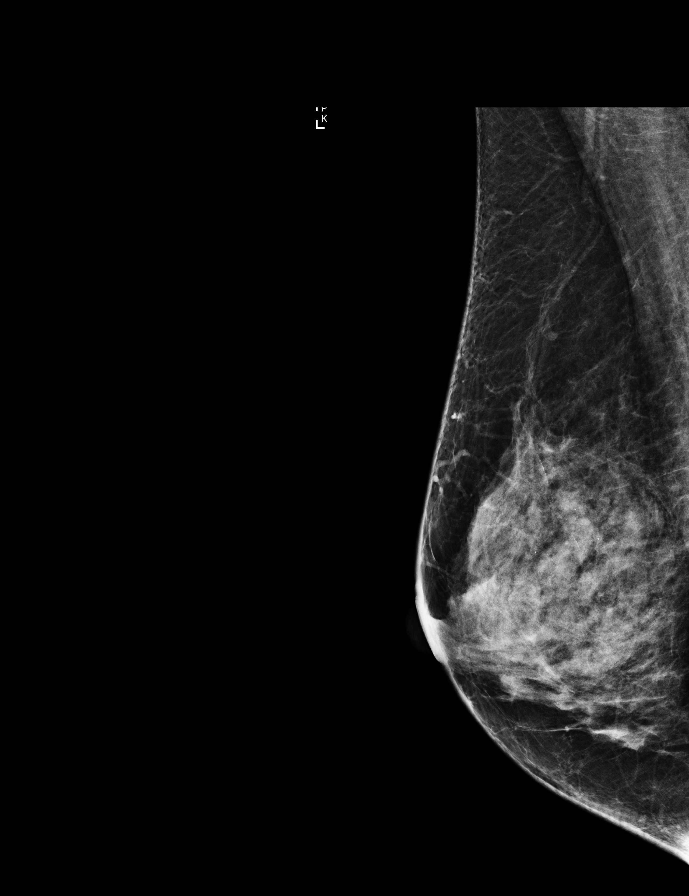

[R CC synth-2D]
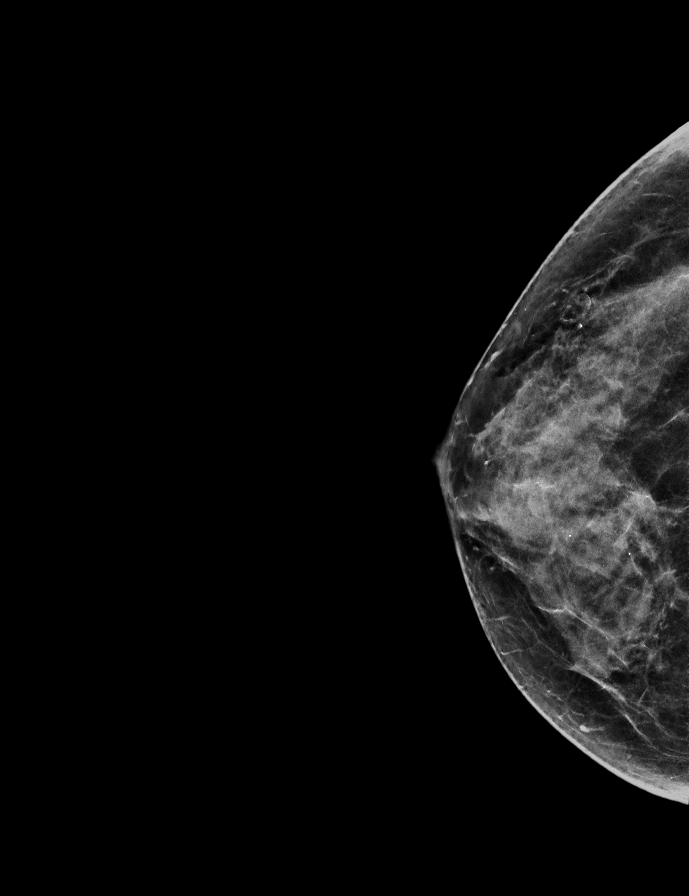

[L CC synth-2D]
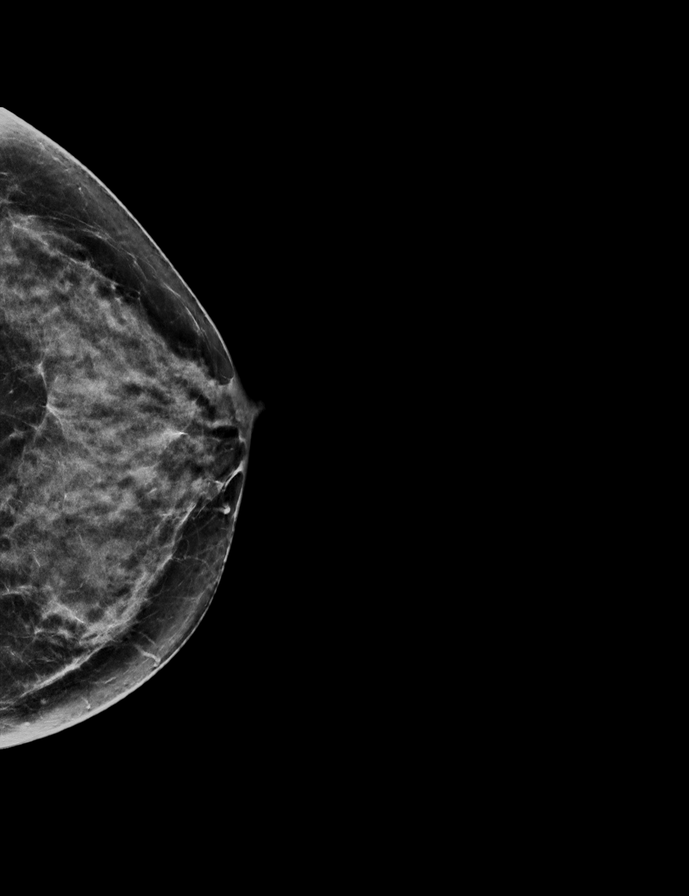

[L CC]
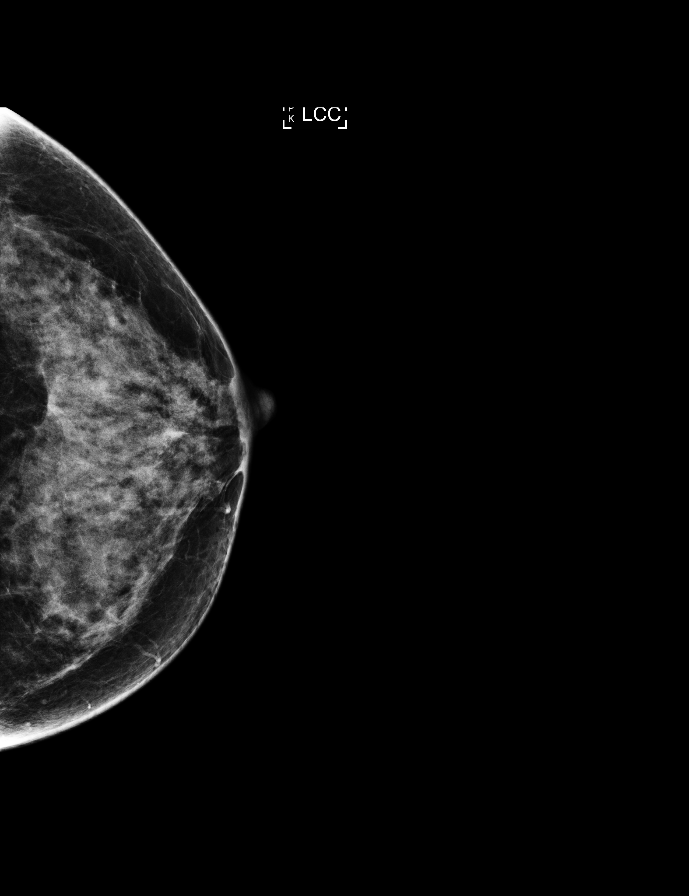

[R MLO tomo · tomo slice 27/54.0]
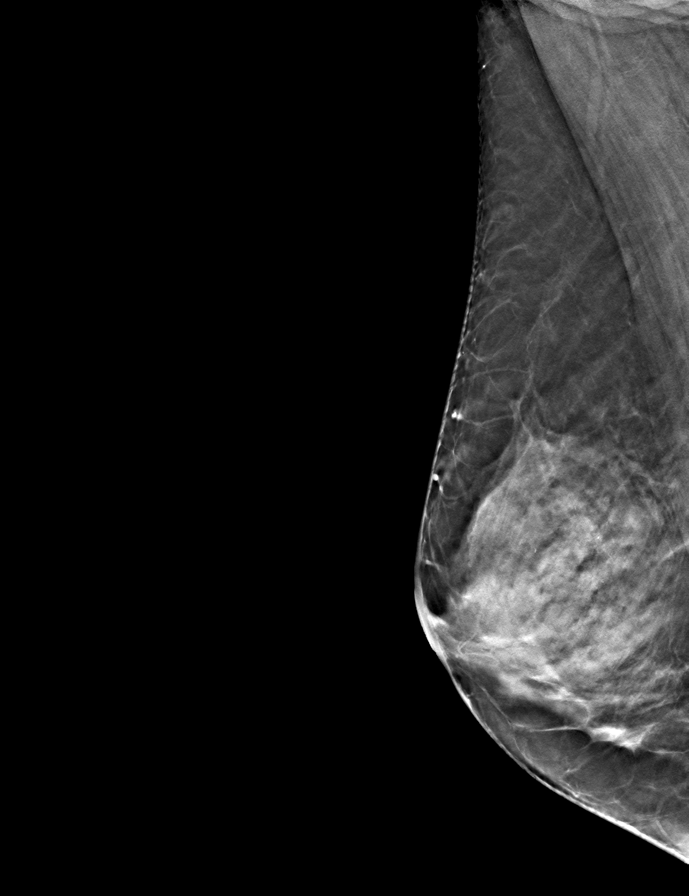

[9 of 28 positions shown; findings below may reference images not displayed]

ACR Breast Density Category c: The breast tissue is heterogeneously
dense, which may obscure small masses.
FINDINGS: There are no findings suspicious for malignancy. Images were
processed with CAD.
IMPRESSION: No mammographic evidence of malignancy. A result letter of this
screening mammogram will be mailed directly to the patient.

RECOMMENDATION:
Screening mammogram in one year. (Code:[TA])

BI-RADS CATEGORY  1: Negative.

## 2016-12-16 ENCOUNTER — Encounter: Payer: Self-pay | Admitting: Obstetrics and Gynecology

## 2016-12-16 ENCOUNTER — Other Ambulatory Visit (HOSPITAL_COMMUNITY)
Admission: RE | Admit: 2016-12-16 | Discharge: 2016-12-16 | Disposition: A | Discharge status: Home / Self Care | Payer: Commercial Managed Care - PPO | Source: Ambulatory Visit | Attending: Obstetrics and Gynecology | Admitting: Obstetrics and Gynecology

## 2016-12-16 ENCOUNTER — Ambulatory Visit (INDEPENDENT_AMBULATORY_CARE_PROVIDER_SITE_OTHER): Payer: Commercial Managed Care - PPO | Admitting: Obstetrics and Gynecology

## 2016-12-16 VITALS — BP 122/70 | HR 78 | Resp 16 | Ht 64.0 in | Wt 132.0 lb

## 2016-12-16 DIAGNOSIS — N930 Postcoital and contact bleeding: Secondary | ICD-10-CM

## 2016-12-16 DIAGNOSIS — Z780 Asymptomatic menopausal state: Secondary | ICD-10-CM | POA: Diagnosis not present

## 2016-12-16 DIAGNOSIS — Z01419 Encounter for gynecological examination (general) (routine) without abnormal findings: Secondary | ICD-10-CM | POA: Diagnosis not present

## 2016-12-16 DIAGNOSIS — N949 Unspecified condition associated with female genital organs and menstrual cycle: Secondary | ICD-10-CM | POA: Diagnosis not present

## 2016-12-16 DIAGNOSIS — Z0000 Encounter for general adult medical examination without abnormal findings: Secondary | ICD-10-CM | POA: Diagnosis not present

## 2016-12-16 DIAGNOSIS — K5900 Constipation, unspecified: Secondary | ICD-10-CM

## 2016-12-16 DIAGNOSIS — L298 Other pruritus: Secondary | ICD-10-CM | POA: Insufficient documentation

## 2016-12-16 DIAGNOSIS — N898 Other specified noninflammatory disorders of vagina: Secondary | ICD-10-CM

## 2016-12-16 DIAGNOSIS — R1032 Left lower quadrant pain: Secondary | ICD-10-CM

## 2016-12-16 DIAGNOSIS — Z124 Encounter for screening for malignant neoplasm of cervix: Secondary | ICD-10-CM | POA: Diagnosis not present

## 2016-12-16 DIAGNOSIS — Z803 Family history of malignant neoplasm of breast: Secondary | ICD-10-CM

## 2016-12-16 LAB — POCT URINALYSIS DIPSTICK
BILIRUBIN UA: NEGATIVE
GLUCOSE UA: NEGATIVE
KETONES UA: NEGATIVE
NITRITE UA: NEGATIVE
Protein, UA: NEGATIVE
Urobilinogen, UA: NEGATIVE E.U./dL — AB
pH, UA: 5 (ref 5.0–8.0)

## 2016-12-16 NOTE — Progress Notes (Signed)
51 y.o. [id number] DivorcedCaucasianF here for annual exam.  She has a few small vulvar lumps, not tender, been there for a long time.  She is sexually active, same partner since 2003. Spotting intermittently with intercourse in the last 6 months. No pain with intercourse, some dryness, helped with lubrication.  LMP was about 1.5 years ago, prior to that it was very irregular. Some night sweats, better currently, no hot flashes.  Between her and her partner, they have 4 grown children, using condoms.  She c/o intermittent pain in her LLQ for the last 1-2 years. Colonoscopy in 2010 for bleeding, pre-cancerous polyps. No polyps in 2015, but was told her colon was curvy. The pain occurs when she has to have a BM. She has constipation, she drinks "smooth move tea" which helps. BM q 1-4 days.  She c/o intermittent itching and odor for the last 1-2 months in the vulvar/vaginal area.     Patient's last menstrual period was 12/01/2011.          Sexually active: Yes.    The current method of family planning is condoms every time.    Exercising: Yes.    walking Smoker:  no  Health Maintenance: Pap:  2016 WNL per patient  History of abnormal Pap:  Yes dysplasia years ago, no surgery on her cervix MMG:  03-05-16 WNL  Colonoscopy:  2015 polyps  BMD:   Never TDaP:  unsure Gardasil: N/A   reports that she has never smoked. She has never used smokeless tobacco. She reports that she drinks about 1.2 - 1.8 oz of alcohol per week . She reports that she does not use drugs. She does desk work, billing. She has 63 and 89 year old sons. Both in college. Parents have medical issues, near by. She helps out with her parents. Dad with dementia, mom with lung cancer.   Past Medical History:  Diagnosis Date  . Anemia     No past surgical history on file.  No current outpatient prescriptions on file.   No current facility-administered medications for this visit.     Family History  Problem Relation Age of Onset   . Lung cancer Mother   . Heart attack Mother   . Throat cancer Father   . Dementia Father   . Heart disease Father   . Breast cancer Paternal Aunt   . Thyroid disease Paternal Aunt   . Breast cancer Paternal Grandmother   PAunt late 36's, early 84's, GM was also older when she had breast cancer. PGM had 6-7 sisters, all of them breast cancer and dad's only sister with breast cancer. Father has 3 brothers and 1 sister. No cousins with breast cancer, but some of her Dad's cousins kids have breast cancer.   Review of Systems  Constitutional: Negative.   HENT: Negative.   Eyes: Negative.   Respiratory: Negative.   Cardiovascular: Negative.   Gastrointestinal: Negative.   Endocrine: Negative.   Genitourinary:       Vaginal irritation Vaginal itching Left side pelvic pain   Musculoskeletal: Negative.   Skin: Negative.   Allergic/Immunologic: Negative.   Neurological: Negative.   Psychiatric/Behavioral: Negative.     Exam:   BP 122/70 (BP Location: Right Arm, Patient Position: Sitting, Cuff Size: Normal)   Pulse 78   Resp 16   Ht 5\' 4"  (1.626 m)   Wt 132 lb (59.9 kg)   LMP 12/01/2011   BMI 22.66 kg/m   Weight change: @WEIGHTCHANGE @ Height:   Height:  5\' 4"  (162.6 cm)  Ht Readings from Last 3 Encounters:  12/16/16 5\' 4"  (1.626 m)    General appearance: alert, cooperative and appears stated age Head: Normocephalic, without obvious abnormality, atraumatic Neck: no adenopathy, supple, symmetrical, trachea midline and thyroid normal to inspection and palpation Lungs: clear to auscultation bilaterally Cardiovascular: regular rate and rhythm Breasts: normal appearance, no masses or tenderness Abdomen: soft, mildly tender in the LLQ in the region of the descending colon, stool palpated in the colon.  No masses,  no organomegaly Extremities: extremities normal, atraumatic, no cyanosis or edema Skin: Skin color, texture, turgor normal. No rashes or lesions Lymph nodes: Cervical,  supraclavicular, and axillary nodes normal. No abnormal inguinal nodes palpated Neurologic: Grossly normal   Pelvic: External genitalia:  no lesions, 2 epidermal cysts (patient reassured normal)              Urethra:  normal appearing urethra with no masses, tenderness or lesions              Bartholins and Skenes: normal                 Vagina: atrophic appearing vagina with normal color and discharge, no lesions              Cervix: no lesions and friable with pap               Bimanual Exam:  Uterus:  normal size, contour, position, consistency, mobility, non-tender and anteverted              Adnexa: no mass, fullness, tenderness               Rectovaginal: Confirms               Anus:  normal sphincter tone, no lesions  Chaperone was present for exam.  A:  Well Woman with normal exam  Strong family history of late onset breast cancer  Spotting with intercourse, slightly friable cervix  Intermittent LLQ abdominal pain, seems correlated with constipation  P:   Pap with hpv  Will send for genetics evaluation  Recommended she do monthly breast self exams and 3D mammogram  Discussed calcium and vit d  Screening labs  [facility]  Return for an ultrasound, possible sonohysterogram, possible endometrial biopsy  Recommended she increase fruit, fiber and fluids in her diet, discussed using miralax or metamucil

## 2016-12-16 NOTE — Patient Instructions (Signed)

## 2016-12-17 ENCOUNTER — Telehealth: Payer: Self-pay | Admitting: Obstetrics and Gynecology

## 2016-12-17 LAB — COMPREHENSIVE METABOLIC PANEL
ALT: 25 IU/L (ref 0–32)
AST: 21 IU/L (ref 0–40)
Albumin/Globulin Ratio: 2.3 — ABNORMAL HIGH (ref 1.2–2.2)
Albumin: 4.5 g/dL (ref 3.5–5.5)
Alkaline Phosphatase: 62 IU/L (ref 39–117)
BILIRUBIN TOTAL: 1.2 mg/dL (ref 0.0–1.2)
BUN/Creatinine Ratio: 16 (ref 9–23)
BUN: 12 mg/dL (ref 6–24)
CO2: 23 mmol/L (ref 20–29)
Calcium: 9.1 mg/dL (ref 8.7–10.2)
Chloride: 102 mmol/L (ref 96–106)
Creatinine, Ser: 0.77 mg/dL (ref 0.57–1.00)
GFR calc non Af Amer: 89 mL/min/{1.73_m2} (ref >59)
GFR, EST AFRICAN AMERICAN: 103 mL/min/{1.73_m2} (ref >59)
Globulin, Total: 2 g/dL (ref 1.5–4.5)
Glucose: 88 mg/dL (ref 65–99)
POTASSIUM: 3.8 mmol/L (ref 3.5–5.2)
Sodium: 141 mmol/L (ref 134–144)
TOTAL PROTEIN: 6.5 g/dL (ref 6.0–8.5)

## 2016-12-17 LAB — CBC
HEMATOCRIT: 41.1 % (ref 34.0–46.6)
Hemoglobin: 14.4 g/dL (ref 11.1–15.9)
MCH: 29.8 pg (ref 26.6–33.0)
MCHC: 35 g/dL (ref 31.5–35.7)
MCV: 85 fL (ref 79–97)
PLATELETS: 215 10*3/uL (ref 150–379)
RBC: 4.84 x10E6/uL (ref 3.77–5.28)
RDW: 13 % (ref 12.3–15.4)
WBC: 4.8 10*3/uL (ref 3.4–10.8)

## 2016-12-17 LAB — FOLLICLE STIMULATING HORMONE: FSH: 85.3 m[IU]/mL

## 2016-12-17 LAB — LIPID PANEL
CHOL/HDL RATIO: 2.8 ratio (ref 0.0–4.4)
Cholesterol, Total: 188 mg/dL (ref 100–199)
HDL: 68 mg/dL (ref >39)
LDL Calculated: 108 mg/dL — ABNORMAL HIGH (ref 0–99)
Triglycerides: 59 mg/dL (ref 0–149)
VLDL Cholesterol Cal: 12 mg/dL (ref 5–40)

## 2016-12-17 NOTE — Telephone Encounter (Signed)
Call placed patient to review benefits and schedule recommended sonohysterogram and endometrial biopsy. Left voicemail requesting a return call.

## 2016-12-18 LAB — CYTOLOGY - PAP
Bacterial vaginitis: NEGATIVE
Candida vaginitis: NEGATIVE
DIAGNOSIS: NEGATIVE
HPV (WINDOPATH): NOT DETECTED

## 2016-12-31 ENCOUNTER — Ambulatory Visit (INDEPENDENT_AMBULATORY_CARE_PROVIDER_SITE_OTHER): Payer: Commercial Managed Care - PPO

## 2016-12-31 ENCOUNTER — Encounter: Payer: Self-pay | Admitting: Obstetrics and Gynecology

## 2016-12-31 ENCOUNTER — Other Ambulatory Visit: Payer: Self-pay | Admitting: Obstetrics and Gynecology

## 2016-12-31 ENCOUNTER — Ambulatory Visit (INDEPENDENT_AMBULATORY_CARE_PROVIDER_SITE_OTHER): Payer: Commercial Managed Care - PPO | Admitting: Obstetrics and Gynecology

## 2016-12-31 VITALS — BP 100/58 | HR 80 | Resp 16 | Wt 133.0 lb

## 2016-12-31 DIAGNOSIS — N930 Postcoital and contact bleeding: Secondary | ICD-10-CM

## 2016-12-31 DIAGNOSIS — Z780 Asymptomatic menopausal state: Secondary | ICD-10-CM | POA: Diagnosis not present

## 2016-12-31 NOTE — Progress Notes (Signed)
GYNECOLOGY  VISIT   HPI: 51 y.o.   Divorced  Caucasian  female   G2P2002 with Patient's last menstrual period was 12/01/2011.   here for follow up postmenopause spotting with intercourse. She had a slightly friable cervix on exam with a normal pap, negative hpv, negative BV and candida. The patient hasn't heard back on her referral to Genetics that was made on 12/16/16. At her last visit she c/o intermittent LLQ abdominal pain and constipation. Pain resolved with use of miralax.   GYNECOLOGIC HISTORY: Patient's last menstrual period was 12/01/2011. Contraception:postmenopause  Menopausal hormone therapy: none         OB History    Gravida Para Term Preterm AB Living   2 2 2     2    SAB TAB Ectopic Multiple Live Births           2         There are no active problems to display for this patient.   Past Medical History:  Diagnosis Date  . Anemia     No past surgical history on file.  No current outpatient prescriptions on file.   No current facility-administered medications for this visit.      ALLERGIES: Patient has no known allergies.  Family History  Problem Relation Age of Onset  . Lung cancer Mother   . Heart attack Mother   . Throat cancer Father   . Dementia Father   . Heart disease Father   . Breast cancer Paternal Aunt   . Thyroid disease Paternal Aunt   . Breast cancer Paternal Grandmother     Social History   Social History  . Marital status: Divorced    Spouse name: N/A  . Number of children: N/A  . Years of education: N/A   Occupational History  . Not on file.   Social History Main Topics  . Smoking status: Never Smoker  . Smokeless tobacco: Never Used  . Alcohol use 1.2 - 1.8 oz/week    2 - 3 Standard drinks or equivalent per week  . Drug use: No  . Sexual activity: Yes    Partners: Male    Birth control/ protection: Condom   Other Topics Concern  . Not on file   Social History Narrative  . No narrative on file    Review  of Systems  Constitutional: Negative.   HENT: Negative.   Eyes: Negative.   Respiratory: Negative.   Cardiovascular: Negative.   Gastrointestinal: Negative.   Genitourinary:       Spotting with intercourse   Musculoskeletal: Negative.   Skin: Negative.   Neurological: Negative.   Endo/Heme/Allergies: Negative.   Psychiatric/Behavioral: Negative.     PHYSICAL EXAMINATION:    BP (!) 100/58 (BP Location: Right Arm, Patient Position: Sitting, Cuff Size: Normal)   Pulse 80   Resp 16   Wt 133 lb (60.3 kg)   LMP 12/01/2011   BMI 22.83 kg/m     General appearance: alert, cooperative and appears stated age  ASSESSMENT Postcoital spotting, the patient is PMP and had a slightly friable cervix on exam. Normal ultrasound with thin endometrial stripe. Normal pap Family history of breast cancer, awaiting call from Genetics LLQ abdominal pain has improved with the use of miralax, normal ultrasound    PLAN Patient reassured, thin endometrium Will call the Genetics center to check on scheduling. Continue miralax, can increase fruit, fiber and fluid in her diet.    An After Visit Summary was  printed and given to the patient.

## 2017-01-09 NOTE — Telephone Encounter (Signed)
Appointment completed 12/31/16. Ok to close encounter

## 2017-05-14 ENCOUNTER — Other Ambulatory Visit: Payer: Self-pay | Admitting: Obstetrics and Gynecology

## 2017-05-14 DIAGNOSIS — Z1231 Encounter for screening mammogram for malignant neoplasm of breast: Secondary | ICD-10-CM

## 2017-06-13 ENCOUNTER — Ambulatory Visit: Payer: PRIVATE HEALTH INSURANCE

## 2017-07-01 ENCOUNTER — Ambulatory Visit
Admission: RE | Admit: 2017-07-01 | Discharge: 2017-07-01 | Disposition: A | Discharge status: Home / Self Care | Payer: Commercial Managed Care - PPO | Source: Ambulatory Visit | Attending: Obstetrics and Gynecology | Admitting: Obstetrics and Gynecology

## 2017-07-01 DIAGNOSIS — Z1231 Encounter for screening mammogram for malignant neoplasm of breast: Secondary | ICD-10-CM

## 2017-07-01 IMAGING — MG 2D DIGITAL SCREENING BILATERAL MAMMOGRAM WITH 3D TOMO WITH CAD
9 of 12 series · 9 of 28 positions shown · non-contrast
Comparison: Previous exam(s).

CLINICAL DATA: Screening.

EXAM:
2D DIGITAL SCREENING BILATERAL MAMMOGRAM WITH 3D TOMO WITH CAD

[L CC synth-2D]
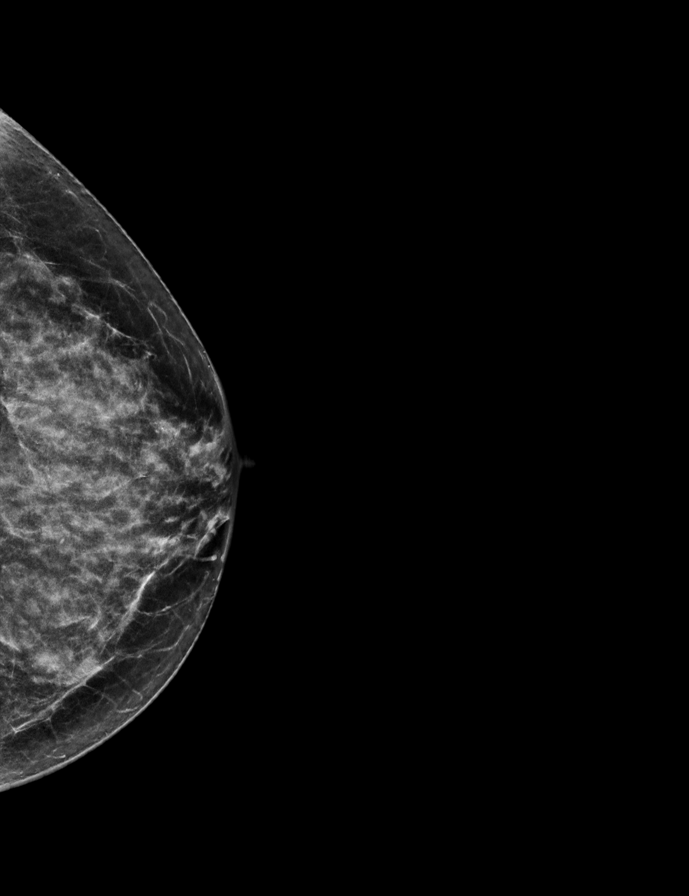

[R MLO synth-2D]
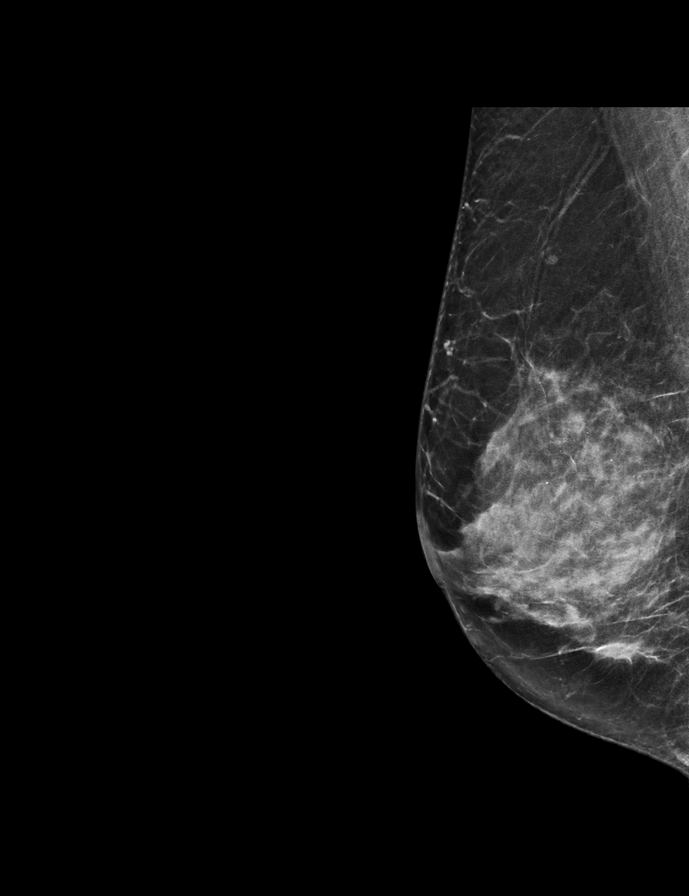

[R MLO]
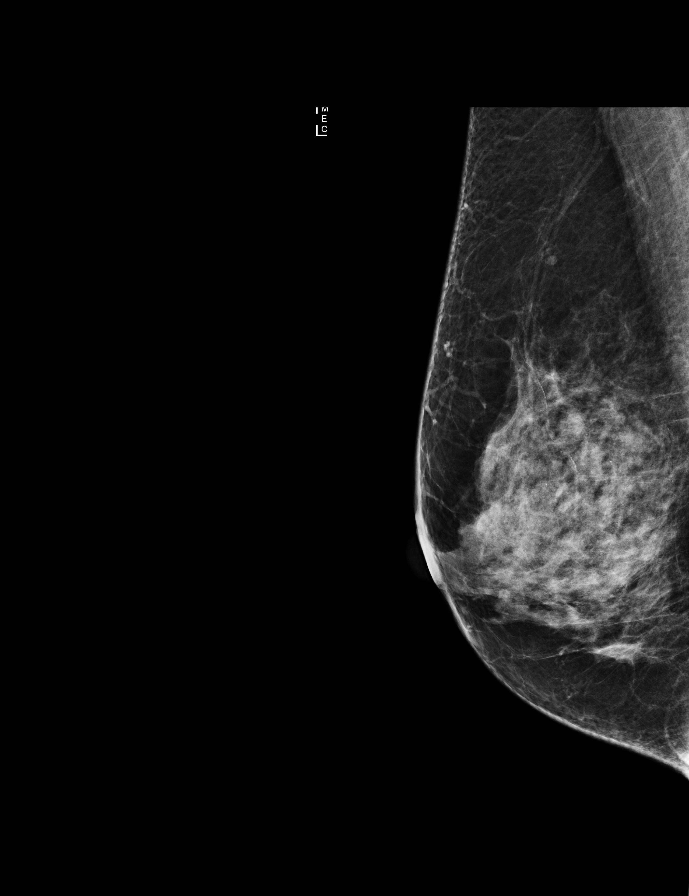

[R CC]
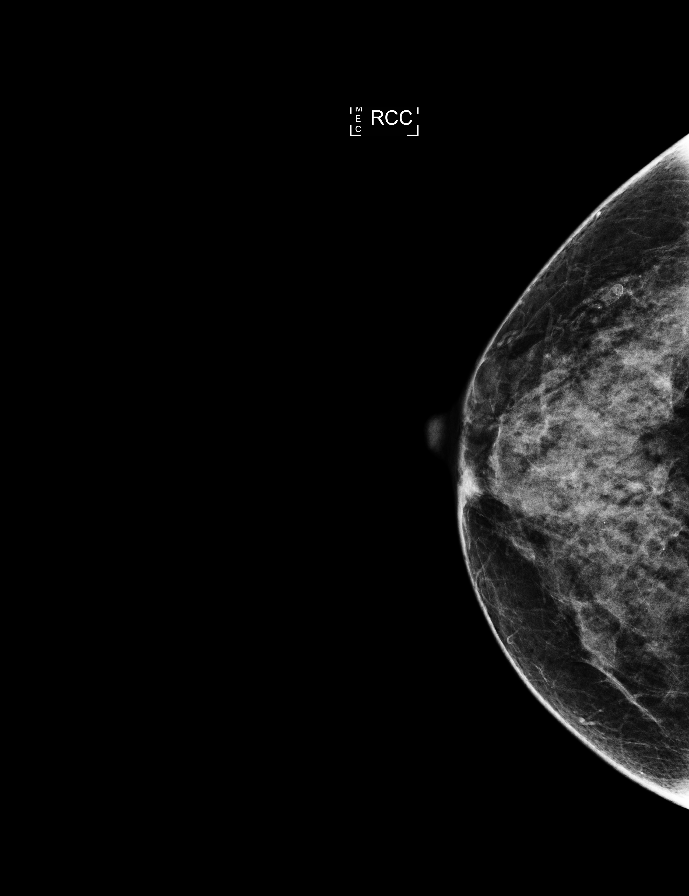

[L MLO synth-2D]
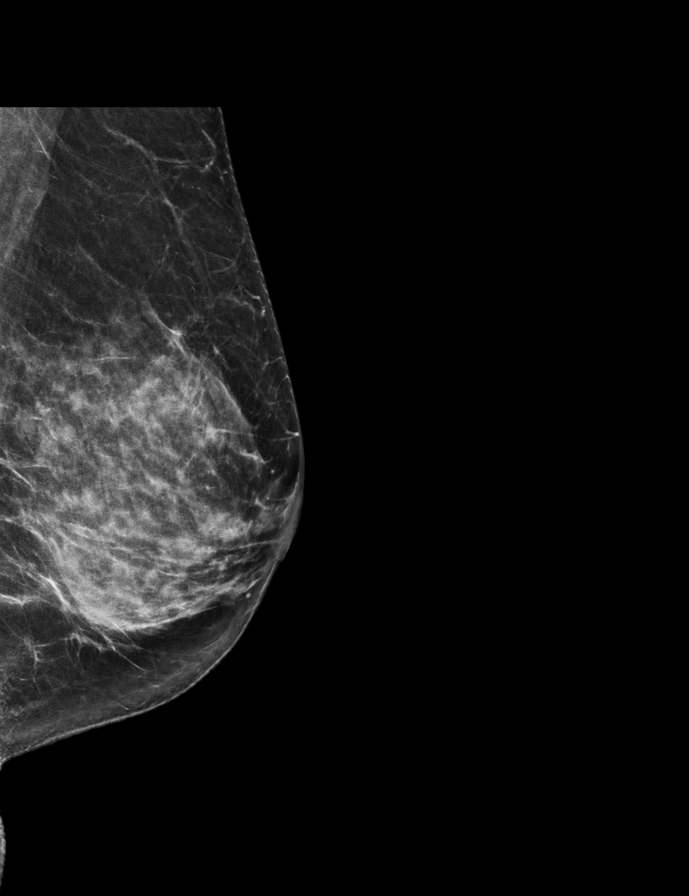

[L MLO]
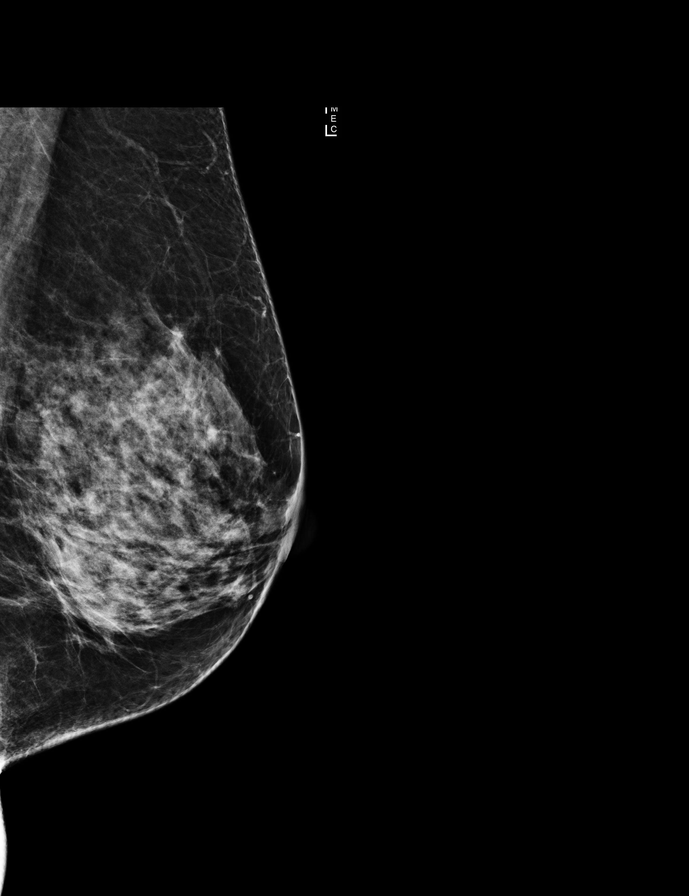

[L CC]
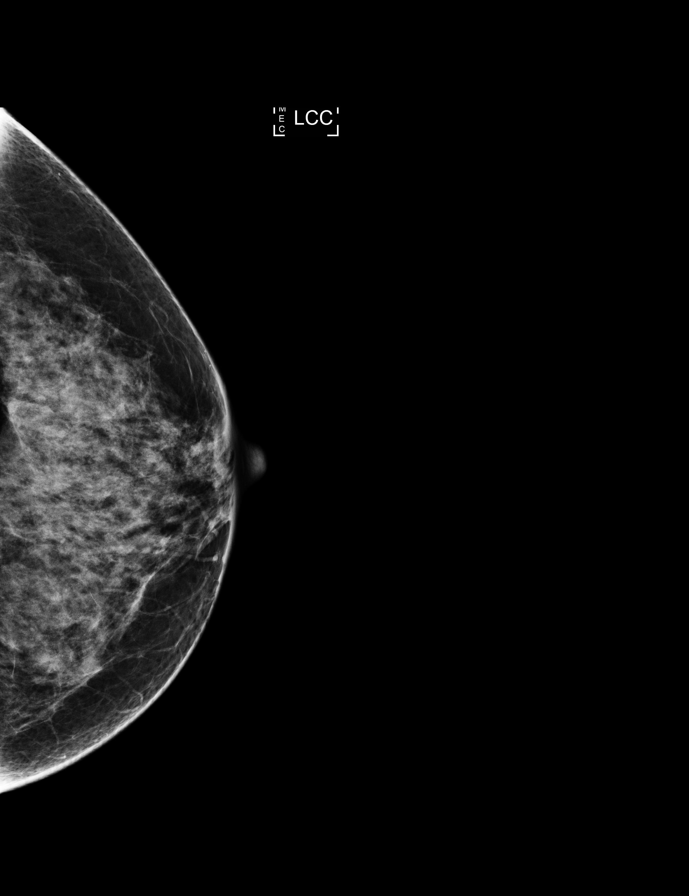

[R CC synth-2D]
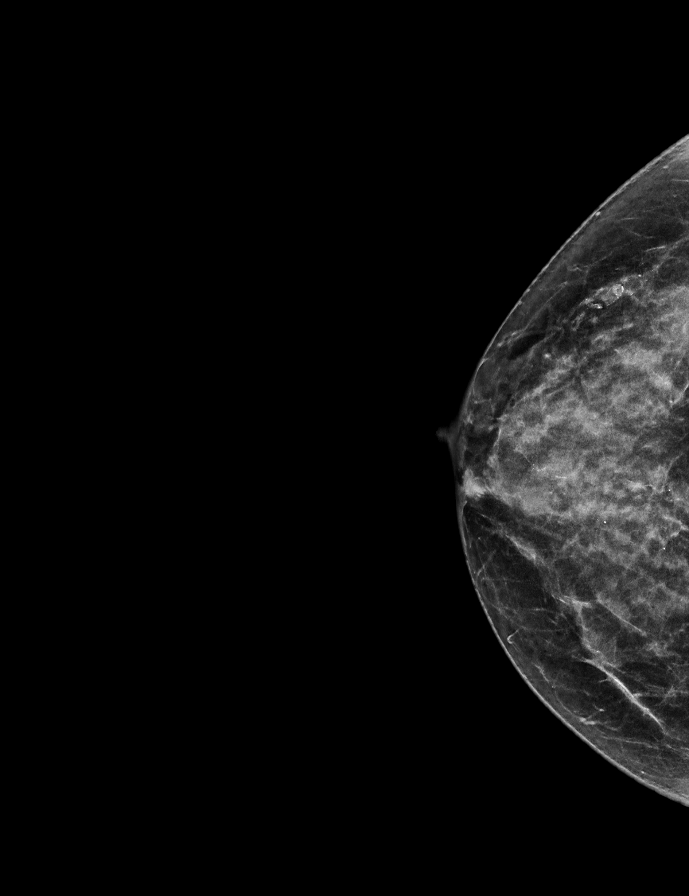

[R MLO tomo · tomo slice 31/61.0]
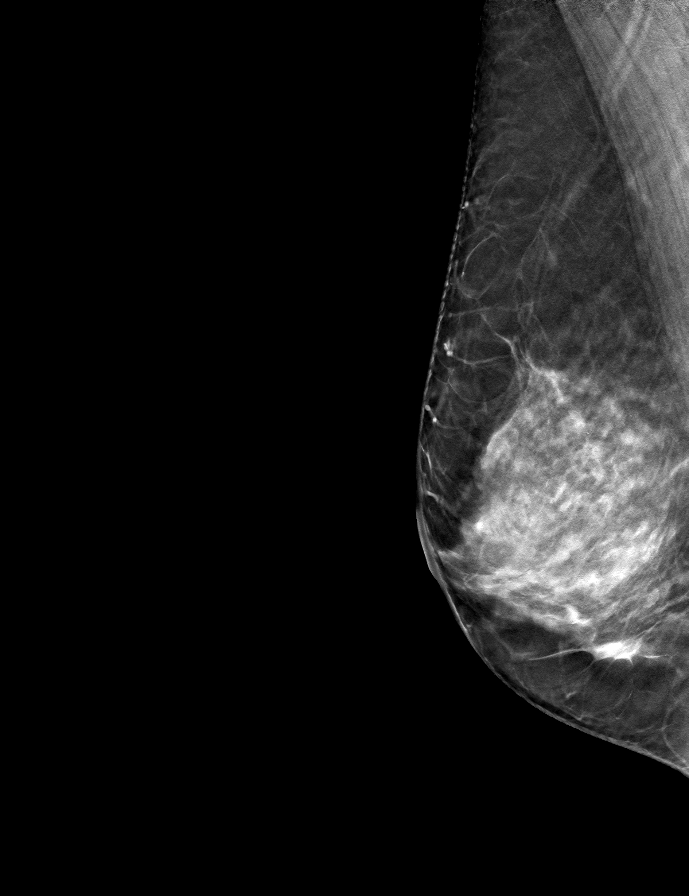

[9 of 28 positions shown; findings below may reference images not displayed]

ACR Breast Density Category c: The breast tissue is heterogeneously
dense, which may obscure small masses.
FINDINGS: There are no findings suspicious for malignancy. Images were
processed with CAD.
IMPRESSION: No mammographic evidence of malignancy. A result letter of this
screening mammogram will be mailed directly to the patient.

RECOMMENDATION:
Screening mammogram in one year. (Code:[4B])

BI-RADS CATEGORY  1: Negative.

## 2017-11-03 DIAGNOSIS — R739 Hyperglycemia, unspecified: Secondary | ICD-10-CM | POA: Diagnosis not present

## 2017-11-03 DIAGNOSIS — R55 Syncope and collapse: Secondary | ICD-10-CM | POA: Diagnosis not present

## 2017-11-12 DIAGNOSIS — R55 Syncope and collapse: Secondary | ICD-10-CM | POA: Insufficient documentation

## 2017-11-12 DIAGNOSIS — R569 Unspecified convulsions: Secondary | ICD-10-CM

## 2017-11-13 ENCOUNTER — Encounter: Payer: Self-pay | Admitting: Cardiology

## 2017-11-13 ENCOUNTER — Ambulatory Visit (INDEPENDENT_AMBULATORY_CARE_PROVIDER_SITE_OTHER): Payer: Commercial Managed Care - PPO | Admitting: Cardiology

## 2017-11-13 ENCOUNTER — Telehealth: Payer: Self-pay | Admitting: *Deleted

## 2017-11-13 VITALS — BP 118/68 | HR 64 | Ht 65.0 in | Wt 142.0 lb

## 2017-11-13 DIAGNOSIS — R55 Syncope and collapse: Secondary | ICD-10-CM | POA: Diagnosis not present

## 2017-11-13 DIAGNOSIS — R9401 Abnormal electroencephalogram [EEG]: Secondary | ICD-10-CM | POA: Diagnosis not present

## 2017-11-13 NOTE — Telephone Encounter (Signed)
Spoke with a [facility] associate regarding referral due to abnormal EEG to see if patient could get a sooner appointment. Advised that there were no sooner appointments available. A number for the on-call provider was provided for Dr. Agustin Cree to speak with the on-call provider to do a peer-to-peer review regarding this patient.This is the only way to get an appointment quicker at this time. Will inform Dr. Agustin Cree.

## 2017-11-13 NOTE — Progress Notes (Signed)
Cardiology Consultation:    Date:  11/13/2017   ID:  Katherine Ritter, DOB 03/26/1966, MRN [id number]  PCP:  Cher Nakai, MD  Cardiologist:  Jenne Campus, MD   Referring MD: Cher Nakai, MD   Chief Complaint  Patient presents with  . Loss of Consciousness  I passed out  History of Present Illness:    Katherine Ritter is a 52 y.o. female who is being seen today for the evaluation of ectopy at the request of Cher Nakai, MD.  She is a healthy 52 years old woman who few weeks ago suffered from syncope.  That was about 11:00 PM she was lying down in bed watching TV suddenly started feeling strange she took her glasses off she had difficulty seeing TV was somewhat blurred this sensation lasted for about 1 minute.  After that she decided to get up and alarm her husband she got up make few steps and then this is the last thing that she remembers.  Her husband saw this episode and she he said that she got up started walking towards him on the hallway kind of drunk looking with strange looking her face and then she collapsed.  He looks very pale.  After that she sort of wake up about 45 seconds later was confused for a little but did not know who he was in the meantime he called 911 then when EMS came she was already completely with it alert awake and oriented.  Lasting that she remember is the fact that she is getting of the bed trying to go towards her husband and next thing she knows is the fact that she is talking to her husband.  She clearly remember EMS coming and bring her to the hospital.  She wet herself during the episode.  There was no tongue biting.  She was admitted to the hospital.  Quite extensive evaluation has been done.  She was continuously monitored without any arrhythmia being picked up.  She had an echocardiogram which basically was normal also a stress test which was good in quality and normal.  She is wearing an event recorder right now.  She never had episode like this before.  She  described to have some palpitations from time to time her heart can abruptly start spitting up.  However, during this episode she did not have it. Is no family history of premature cardiac death.  Some family members that he has coronary artery disease and apparently her grandfather died because of massive heart attack. She does not exercise regularly but try to walk with her husband to stay healthy.  Denies having any chest pain tightness squeezing pressure burning chest.  Past Medical History:  Diagnosis Date  . Anemia       Current Medications: No outpatient medications have been marked as taking for the 11/13/17 encounter (Office Visit) with Park Liter, MD.     Allergies:   Patient has no known allergies.   Social History   Socioeconomic History  . Marital status: Divorced    Spouse name: Not on file  . Number of children: Not on file  . Years of education: Not on file  . Highest education level: Not on file  Occupational History  . Not on file  Social Needs  . Financial resource strain: Not on file  . Food insecurity:    Worry: Not on file    Inability: Not on file  . Transportation needs:    Medical: Not  on file    Non-medical: Not on file  Tobacco Use  . Smoking status: Never Smoker  . Smokeless tobacco: Never Used  Substance and Sexual Activity  . Alcohol use: Yes    Alcohol/week: 1.2 - 1.8 oz    Types: 2 - 3 Standard drinks or equivalent per week  . Drug use: No  . Sexual activity: Yes    Partners: Male    Birth control/protection: Condom  Lifestyle  . Physical activity:    Days per week: Not on file    Minutes per session: Not on file  . Stress: Not on file  Relationships  . Social connections:    Talks on phone: Not on file    Gets together: Not on file    Attends religious service: Not on file    Active member of club or organization: Not on file    Attends meetings of clubs or organizations: Not on file    Relationship status: Not on file    Other Topics Concern  . Not on file  Social History Narrative  . Not on file     Family History: The patient's family history includes Breast cancer in her paternal aunt and paternal grandmother; Dementia in her father; Heart attack in her mother; Heart disease in her father; Lung cancer in her mother; Throat cancer in her father; Thyroid disease in her paternal aunt. ROS:   Please see the history of present illness.    All 14 point review of systems negative except as described per history of present illness.  EKGs/Labs/Other Studies Reviewed:    The following studies were reviewed today: EKG, echo, stress test reviewed from hospital  EKG:  EKG is  ordered today.  The ekg ordered today demonstrates normal sinus rhythm poor R wave progression in anterior precordium however patient was wearing Holter therefore we had difficulty placing the lead correctly.  There is no ST segment changes QT interval was normal  Recent Labs: 12/16/2016: ALT 25; BUN 12; Creatinine, Ser 0.77; Hemoglobin 14.4; Platelets 215; Potassium 3.8; Sodium 141  Recent Lipid Panel    Component Value Date/Time   CHOL 188 12/16/2016 1508   TRIG 59 12/16/2016 1508   HDL 68 12/16/2016 1508   CHOLHDL 2.8 12/16/2016 1508   LDLCALC 108 (H) 12/16/2016 1508    Physical Exam:    VS:  BP 118/68   Pulse 64   Ht 5\' 5"  (1.651 m)   Wt 142 lb (64.4 kg)   LMP 12/01/2011   SpO2 99%   BMI 23.63 kg/m     Wt Readings from Last 3 Encounters:  11/13/17 142 lb (64.4 kg)  12/31/16 133 lb (60.3 kg)  12/16/16 132 lb (59.9 kg)     GEN:  Well nourished, well developed in no acute distress HEENT: Normal NECK: No JVD; No carotid bruits LYMPHATICS: No lymphadenopathy CARDIAC: RRR, no murmurs, no rubs, no gallops RESPIRATORY:  Clear to auscultation without rales, wheezing or rhonchi  ABDOMEN: Soft, non-tender, non-distended MUSCULOSKELETAL:  No edema; No deformity  SKIN: Warm and dry NEUROLOGIC:  Alert and oriented x  3 PSYCHIATRIC:  Normal affect   ASSESSMENT:    1. Syncope, unspecified syncope type   2. Abnormal EEG    PLAN:    In order of problems listed above:  1. Syncope with some worrisome future.  She did have however prodromal symptoms for about a minute before that happens.  She did have a EEG done and EEG shows some abnormality.  She is scheduled to see neurology in August.  I think the key will be to continue wearing a event recorder.  Echocardiogram and stress test is normal therefore less likely to have a little arrhythmia however her story again worrisome.  I will call neurology try to see if they can see her sooner to explain the abnormal EEG. 2. Mild dyslipidemia: We will not initiate any therapy for now. 3. She may require EP evaluation in the future.  I see her back in my office in about 6 weeks or sooner if she has a problem   Medication Adjustments/Labs and Tests Ordered: Current medicines are reviewed at length with the patient today.  Concerns regarding medicines are outlined above.  No orders of the defined types were placed in this encounter.  No orders of the defined types were placed in this encounter.   Signed, Park Liter, MD, [facility]. 11/13/2017 9:21 AM    [place]

## 2017-11-13 NOTE — Patient Instructions (Signed)
Medication Instructions:  Your physician recommends that you continue on your current medications as directed. Please refer to the Current Medication list given to you today.  Labwork: None ordered  Testing/Procedures: None ordered  Follow-Up: Your physician recommends that you schedule a follow-up appointment in: 6 week follow up with Dr. Agustin Cree   Any Other Special Instructions Will Be Listed Below (If Applicable).     If you need a refill on your cardiac medications before your next appointment, please call your pharmacy.

## 2017-11-18 ENCOUNTER — Ambulatory Visit (INDEPENDENT_AMBULATORY_CARE_PROVIDER_SITE_OTHER): Payer: Commercial Managed Care - PPO | Admitting: Neurology

## 2017-11-18 ENCOUNTER — Telehealth: Payer: Self-pay | Admitting: *Deleted

## 2017-11-18 ENCOUNTER — Encounter: Payer: Self-pay | Admitting: Neurology

## 2017-11-18 ENCOUNTER — Encounter

## 2017-11-18 VITALS — BP 105/70 | HR 67 | Ht 65.0 in | Wt 142.0 lb

## 2017-11-18 DIAGNOSIS — Z8349 Family history of other endocrine, nutritional and metabolic diseases: Secondary | ICD-10-CM | POA: Diagnosis not present

## 2017-11-18 DIAGNOSIS — E538 Deficiency of other specified B group vitamins: Secondary | ICD-10-CM

## 2017-11-18 DIAGNOSIS — R7309 Other abnormal glucose: Secondary | ICD-10-CM | POA: Diagnosis not present

## 2017-11-18 DIAGNOSIS — R569 Unspecified convulsions: Secondary | ICD-10-CM

## 2017-11-18 DIAGNOSIS — R5383 Other fatigue: Secondary | ICD-10-CM

## 2017-11-18 NOTE — Patient Instructions (Addendum)
MRI brain w/wo contrast 3-day eeg    Vasovagal Syncope, Adult Syncope, which is commonly known as fainting or passing out, is a temporary loss of consciousness. It occurs when the blood flow to the brain is reduced. Vasovagal syncope, also called neurocardiogenic syncope, is a fainting spell that happens when blood flow to the brain is reduced because of a sudden drop in heart rate and blood pressure. Vasovagal syncope is usually harmless. However, you can get injured if you fall during a fainting spell. What are the causes? This condition is caused by a drop in heart rate and blood pressure, usually in response to a trigger. Many things and situations can trigger an episode, including:  Pain.  Fear.  The sight of blood. This may occur during medical procedures, such as when blood is being drawn from a vein.  Common activities, such as coughing, swallowing, stretching, or going to the bathroom.  Emotional stress.  Being in a confined space.  Prolonged standing, especially in a warm environment.  Lack of sleep or rest.  Not eating for a long time.  Not drinking enough liquids.  Recent illness.  Drinking alcohol.  Taking drugs that affect blood pressure, such as marijuana, cocaine, opiates, or inhalants.  What are the signs or symptoms? Before a fainting episode, you may:  Feel dizzy or light-headed.  Become pale.  Sense that you are going to faint.  Feel like the room is spinning.  Only see directly ahead (tunnel vision).  Feel sick to your stomach (nauseous).  See spots.  Slowly lose vision.  Hear ringing in your ears.  Have a headache.  Feel warm and sweaty.  Feel a sensation of pins and needles.  During the fainting spell, you may twitch or make jerky movements. Fainting spells usually last no longer than a few minutes before you wake up. If you get up too quickly before your body can recover, you may faint again. How is this diagnosed? This  condition is diagnosed based on your symptoms, your medical history, and a physical exam. Tests may be done to rule out other causes of fainting. Tests may include:  Blood tests.  Heart tests, such as an electrocardiogram (ECG), echocardiogram, or electrophysiology study.  A test to check your response to changes in position (tilt table test).  How is this treated? Usually, treatment is not needed for this condition. Your health care provider may suggest ways to help prevent fainting episodes. These may include:  Drinking additional fluids if you are exposed to a trigger.  Sitting or lying down if you notice signs that an episode is coming.  If your fainting spells continue, your health care provider may recommend that you:  Take medicines to prevent fainting or to help reduce further episodes of fainting.  Do certain exercises.  Wear compression stockings.  Have surgery to place a pacemaker in your body (rare).  Follow these instructions at home:  Learn to identify the signs that an episode is coming.  Sit or lie down at the first sign of a fainting spell. If you sit down, put your head down between your legs. If you lie down, swing your legs up in the air to increase blood flow to the brain.  Avoid hot tubs and saunas.  Avoid standing for a long time. If you have to stand for a long time, try: ? Crossing your legs. ? Flexing and stretching your leg muscles. ? Squatting. ? Moving your legs. ? Bending over.  Drink  enough fluid to keep your urine clear or pale yellow.  Make changes to your diet that your health care provider recommends. You may be told to: ? Avoid caffeine. ? Eat more salt.  Take over-the-counter and prescription medicines only as told by your health care provider. Contact a health care provider if:  You continue to have fainting spells despite treatment.  You faint more often despite treatment.  You lose consciousness for more than a few  minutes.  You faint during or after exercising or after being startled.  You have twitching or jerky movements for longer than a few seconds during a fainting spell.  You have an episode of twitching or jerky movements without fainting. Get help right away if:  A fainting spell leads to an injury or bleeding.  You have new symptoms that occur with the fainting spells, such as: ? Shortness of breath. ? Chest pain. ? Irregular heartbeat.  You twitch or make jerky movements for more than 5 minutes.  You twitch or make jerky movements during more than one fainting spell. This information is not intended to replace advice given to you by your health care provider. Make sure you discuss any questions you have with your health care provider. Document Released: 05/13/2012 Document Revised: 11/08/2015 Document Reviewed: 03/25/2015 Elsevier Interactive Patient Education  2018 [employer]. Seizure, Adult A seizure is a sudden burst of abnormal electrical activity in the brain. The abnormal activity temporarily interrupts normal brain function, causing a person to experience any of the following:  Involuntary movements.  Changes in awareness or consciousness.  Uncontrollable shaking (convulsions).  Seizures usually last from 30 seconds to 2 minutes. They usually do not cause permanent brain damage unless they are prolonged. What can cause a seizure to happen? Seizures can happen for many reasons including:  A fever.  Low blood sugar.  A medicine.  An illnesses.  A brain injury.  Some people who have a seizure never have another one. People who have repeated seizures have a condition called epilepsy. What are the symptoms of a seizure? Symptoms of a seizure vary greatly from person to person. They include:  Convulsions.  Stiffening of the body.  Involuntary movements of the arms or legs.  Loss of consciousness.  Breathing problems.  Falling  suddenly.  Confusion.  Head nodding.  Eye blinking or fluttering.  Lip smacking.  Drooling.  Rapid eye movements.  Grunting.  Loss of bladder control and bowel control.  Staring.  Unresponsiveness.  Some people have symptoms right before a seizure happens (aura) and right after a seizure happens. Symptoms of an aura include:  Fear or anxiety.  Nausea.  Feeling like the room is spinning (vertigo).  A feeling of having seen or heard something before (deja vu).  Odd tastes or smells.  Changes in vision, such as seeing flashing lights or spots.  Symptoms that may follow a seizure include:  Confusion.  Sleepiness.  Headache.  Weakness of one side of the body.  Follow these instructions at home: Medicines   Take over-the-counter and prescription medicines only as told by your health care provider.  Avoid any substances that may prevent your medicine from working properly, such as alcohol. Activity  Do not drive, swim, or do any other activities that would be dangerous if you had another seizure. Wait until your health care provider approves.  If you live in the [place] check with your local DMV (department of motor vehicles) to find out about the local driving  laws. Each state has specific rules about when you can legally return to driving.  Get enough rest. Lack of sleep can make seizures more likely to occur. Educating others Teach friends and family what to do if you have a seizure. They should:  Lay you on the ground to prevent a fall.  Cushion your head and body.  Loosen any tight clothing around your neck.  Turn you on your side. If vomiting occurs, this helps keep your airway clear.  Stay with you until you recover.  Not hold you down. Holding you down will not stop the seizure.  Not put anything in your mouth.  Know whether or not you need emergency care.  General instructions  Contact your health care provider each time you have a  seizure.  Avoid anything that has ever triggered a seizure for you.  Keep a seizure diary. Record what you remember about each seizure, especially anything that might have triggered the seizure.  Keep all follow-up visits as told by your health care provider. This is important. Contact a health care provider if:  You have another seizure.  You have seizures more often.  Your seizure symptoms change.  You continue to have seizures with treatment.  You have symptoms of an infection or illness. They might increase your risk of having a seizure. Get help right away if:  You have a seizure: ? That lasts longer than 5 minutes. ? That is different than previous seizures. ? That leaves you unable to speak or use a part of your body. ? That makes it harder to breathe. ? After a head injury.  You have: ? Multiple seizures in a row. ? Confusion or a severe headache right after a seizure.  You are having seizures more often.  You do not wake up immediately after a seizure.  You injure yourself during a seizure. These symptoms may represent a serious problem that is an emergency. Do not wait to see if the symptoms will go away. Get medical help right away. Call your local emergency services (911 in the [place] Do not drive yourself to the hospital. This information is not intended to replace advice given to you by your health care provider. Make sure you discuss any questions you have with your health care provider. Document Released: 05/24/2000 Document Revised: 01/21/2016 Document Reviewed: 12/29/2015 Elsevier Interactive Patient Education  [employer].
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
## 2017-11-18 NOTE — Telephone Encounter (Signed)
Faxed completed/signed neurovative diagnostics referral along with insurance card copy & office note. Received a receipt of confirmation.

## 2017-11-19 ENCOUNTER — Telehealth: Payer: Self-pay | Admitting: Neurology

## 2017-11-19 NOTE — Telephone Encounter (Signed)
Received fax from [facility] saying that referral has been received and is in process. Will receive another update when pt has been scheduled.

## 2017-11-19 NOTE — Telephone Encounter (Signed)
MR Brain w/wo contrast Dr. Herma Carson Auth: [id number] (exp. 11/19/17 to 12/18/17). Pt is scheduled for 11/25/17 at [facility].

## 2017-11-20 LAB — TSH: TSH: 2.28 u[IU]/mL (ref 0.450–4.500)

## 2017-11-20 LAB — COMPREHENSIVE METABOLIC PANEL
A/G RATIO: 2.5 — AB (ref 1.2–2.2)
ALT: 16 IU/L (ref 0–32)
AST: 14 IU/L (ref 0–40)
Albumin: 5 g/dL (ref 3.5–5.5)
Alkaline Phosphatase: 93 IU/L (ref 39–117)
BUN / CREAT RATIO: 21 (ref 9–23)
BUN: 15 mg/dL (ref 6–24)
Bilirubin Total: 1.4 mg/dL — ABNORMAL HIGH (ref 0.0–1.2)
CO2: 22 mmol/L (ref 20–29)
Calcium: 9.6 mg/dL (ref 8.7–10.2)
Chloride: 103 mmol/L (ref 96–106)
Creatinine, Ser: 0.72 mg/dL (ref 0.57–1.00)
GFR calc Af Amer: 111 mL/min/{1.73_m2} (ref >59)
GFR calc non Af Amer: 96 mL/min/{1.73_m2} (ref >59)
Globulin, Total: 2 g/dL (ref 1.5–4.5)
Glucose: 99 mg/dL (ref 65–99)
POTASSIUM: 4.6 mmol/L (ref 3.5–5.2)
Sodium: 144 mmol/L (ref 134–144)
TOTAL PROTEIN: 7 g/dL (ref 6.0–8.5)

## 2017-11-20 LAB — CBC
Hematocrit: 41.8 % (ref 34.0–46.6)
Hemoglobin: 14.3 g/dL (ref 11.1–15.9)
MCH: 29.4 pg (ref 26.6–33.0)
MCHC: 34.2 g/dL (ref 31.5–35.7)
MCV: 86 fL (ref 79–97)
PLATELETS: 252 10*3/uL (ref 150–450)
RBC: 4.87 x10E6/uL (ref 3.77–5.28)
RDW: 14.5 % (ref 12.3–15.4)
WBC: 4.5 10*3/uL (ref 3.4–10.8)

## 2017-11-20 LAB — HEMOGLOBIN A1C
Est. average glucose Bld gHb Est-mCnc: 103 mg/dL
Hgb A1c MFr Bld: 5.2 % (ref 4.8–5.6)

## 2017-11-20 LAB — METHYLMALONIC ACID, SERUM: METHYLMALONIC ACID: 319 nmol/L (ref 0–378)

## 2017-11-20 LAB — B12 AND FOLATE PANEL
Folate: 9.1 ng/mL (ref >3.0)
Vitamin B-12: 481 pg/mL (ref 232–1245)

## 2017-11-21 ENCOUNTER — Telehealth: Payer: Self-pay | Admitting: *Deleted

## 2017-11-21 NOTE — Telephone Encounter (Signed)
LVM informing patient that her labs are unremarkable. Advised her the office is closed, opens on Mon at 8 am. Left number for any questions.

## 2017-11-25 ENCOUNTER — Ambulatory Visit: Payer: Commercial Managed Care - PPO

## 2017-11-25 DIAGNOSIS — R569 Unspecified convulsions: Secondary | ICD-10-CM | POA: Diagnosis not present

## 2017-11-25 MED ORDER — GADOPENTETATE DIMEGLUMINE 469.01 MG/ML IV SOLN
13.0000 mL | Freq: Once | INTRAVENOUS | Status: AC | PRN
  Administered 2017-11-25: 13 mL via INTRAVENOUS

## 2017-12-01 ENCOUNTER — Telehealth: Payer: Self-pay | Admitting: *Deleted

## 2017-12-01 NOTE — Telephone Encounter (Addendum)
Called pt and informed her that her MRI brain is normal. She verbalized understanding and appreciation.   ----- Message from Melvenia Beam, MD sent at 12/01/2017 10:10 AM EDT ----- Normal MRi brain

## 2017-12-16 NOTE — Progress Notes (Signed)
52 y.o. [id number] Significant OtherCaucasianF here for annual exam.   Last summer the patient was evaluated for post coital bleeding. Thin endometrium on ultrasound, negative pap, negative hpv, negative BV and yeast. Slightly friable cervix on exam.  No vaginal bleeding, no longer spotting after intercourse.  She has a several year h/o mild GSI with a URI. Normally not a problems. She has had some abdominal bloating, has issues with constipation, occasional LLQ abdominal pain. Takes "smooth move tea" to have a BM. 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          Sexually active: Yes.    The current method of family planning is condoms most of the time.    Exercising: Yes.    walking Smoker:  no  Health Maintenance: Pap:  12/16/2016 neg pap/neg HPV, 2016 WNL per patient  History of abnormal Pap:  Yes dysplasia years ago, no surgery on her cervix MMG:  07/01/2017 BI-RADS CATEGORY  1: Negative. Colonoscopy:  2015 polyps, due for f/u next year.   BMD:   Never TDaP:  unsure Gardasil: N/A   reports that she has never smoked. She has never used smokeless tobacco. She reports that she drinks about 0.6 - 1.8 oz of alcohol per week. She reports that she does not use drugs. she has 2 son's in their 20's, parents are local. She does desk work, billing.  Past Medical History:  Diagnosis Date  . Acute allergic rhinitis   . Anemia   . Asthma   . BMI 24.0-24.9, adult   . Dizziness    "I've had inner ear before"  . Syncope     Past Surgical History:  Procedure Laterality Date  . BREAST BIOPSY Right   . skin cancer removal     x4    No current outpatient medications on file.   No current facility-administered medications for this visit.     Family History  Problem Relation Age of Onset  . Lung cancer Mother   . Heart attack Mother   . Throat cancer Father   . Dementia Father   . Heart disease Father   . Stroke Father   . Breast cancer  Paternal Aunt   . Thyroid disease Paternal Aunt   . Breast cancer Paternal Grandmother   . Seizures Neg Hx     Review of Systems  Constitutional: Negative.   HENT: Negative.   Eyes: Negative.   Respiratory: Negative.   Cardiovascular: Negative.   Gastrointestinal: Negative.   Endocrine: Negative.   Genitourinary: Negative.   Musculoskeletal: Negative.   Skin: Negative.   Allergic/Immunologic: Negative.   Neurological: Negative.   Hematological: Negative.   Psychiatric/Behavioral: Negative.     Exam:   BP 122/76 (BP Location: Right Arm, Patient Position: Sitting)   Pulse 68   Wt 140 lb 9.6 oz (63.8 kg)   LMP 12/01/2011   BMI 23.40 kg/m   Weight change: @WEIGHTCHANGE @ Height:      Ht Readings from Last 3 Encounters:  11/18/17 5\' 5"  (1.651 m)  11/13/17 5\' 5"  (1.651 m)  12/16/16 5\' 4"  (1.626 m)    General appearance: alert, cooperative and appears stated age Head: Normocephalic, without obvious abnormality, atraumatic Neck: no adenopathy, supple, symmetrical, trachea midline and thyroid normal to inspection and palpation Lungs: clear to auscultation bilaterally Cardiovascular: regular rate and rhythm Breasts: normal appearance, no masses or tenderness Abdomen: soft, non-tender; non distended,  no masses,  no organomegaly  Extremities: extremities normal, atraumatic, no cyanosis or edema Skin: Skin color, texture, turgor normal. No rashes or lesions Lymph nodes: Cervical, supraclavicular, and axillary nodes normal. No abnormal inguinal nodes palpated Neurologic: Grossly normal   Pelvic: External genitalia:  no lesions              Urethra:  normal appearing urethra with no masses, tenderness or lesions              Bartholins and Skenes: normal                 Vagina: normal appearing vagina with normal color and discharge, no lesions              Cervix: no lesions               Bimanual Exam:  Uterus:  normal size, contour, position, consistency, mobility,  non-tender              Adnexa: no mass, fullness, tenderness               Rectovaginal: Confirms               Anus:  normal sphincter tone, no lesions  Chaperone was present for exam.  A:  Well Woman with normal exam  Family history of breast cancer (PGM, 8 or 9 of her great paternal aunts, PAunt, Pcousins)  P:   Recent labs with Neurology   No pap this year  Colonoscopy next year  Mammogram UTD  Discussed breast self exam  Will refer to Genetics (last year just played phone tag, never seen)  Discussed calcium and vit D intake  TDAP

## 2017-12-17 ENCOUNTER — Encounter: Payer: Self-pay | Admitting: Obstetrics and Gynecology

## 2017-12-17 ENCOUNTER — Ambulatory Visit: Payer: Commercial Managed Care - PPO | Admitting: Obstetrics and Gynecology

## 2017-12-17 ENCOUNTER — Other Ambulatory Visit: Payer: Self-pay

## 2017-12-17 VITALS — BP 122/76 | HR 68 | Wt 140.6 lb

## 2017-12-17 DIAGNOSIS — Z01419 Encounter for gynecological examination (general) (routine) without abnormal findings: Secondary | ICD-10-CM | POA: Diagnosis not present

## 2017-12-17 DIAGNOSIS — Z23 Encounter for immunization: Secondary | ICD-10-CM | POA: Diagnosis not present

## 2017-12-17 DIAGNOSIS — Z803 Family history of malignant neoplasm of breast: Secondary | ICD-10-CM | POA: Diagnosis not present

## 2017-12-17 NOTE — Patient Instructions (Signed)
EXERCISE AND DIET:  We recommended that you start or continue a regular exercise program for good health. Regular exercise means any activity that makes your heart beat faster and makes you sweat.  We recommend exercising at least 30 minutes per day at least 3 days a week, preferably 4 or 5.  We also recommend a diet low in fat and sugar.  Inactivity, poor dietary choices and obesity can cause diabetes, heart attack, stroke, and kidney damage, among others.    ALCOHOL AND SMOKING:  Women should limit their alcohol intake to no more than 7 drinks/beers/glasses of wine (combined, not each!) per week. Moderation of alcohol intake to this level decreases your risk of breast cancer and liver damage. And of course, no recreational drugs are part of a healthy lifestyle.  And absolutely no smoking or even second hand smoke. Most people know smoking can cause heart and lung diseases, but did you know it also contributes to weakening of your bones? Aging of your skin?  Yellowing of your teeth and nails?  CALCIUM AND VITAMIN D:  Adequate intake of calcium and Vitamin D are recommended.  The recommendations 1,200 mg of calcium between diet and supplement and 800 units of Vitamin D per day seems prudent. Certain women may benefit from higher intake of Vitamin D.  If you are among these women, your doctor will have told you during your visit.    PAP SMEARS:  Pap smears, to check for cervical cancer or precancers,  have traditionally been done yearly, although recent scientific advances have shown that most women can have pap smears less often.  However, every woman still should have a physical exam from her gynecologist every year. It will include a breast check, inspection of the vulva and vagina to check for abnormal growths or skin changes, a visual exam of the cervix, and then an exam to evaluate the size and shape of the uterus and ovaries.  And after 54 years of age, a rectal exam is indicated to check for rectal  cancers. We will also provide age appropriate advice regarding health maintenance, like when you should have certain vaccines, screening for sexually transmitted diseases, bone density testing, colonoscopy, mammograms, etc.   MAMMOGRAMS:  All women over 54 years old should have a yearly mammogram. Many facilities now offer a "3D" mammogram, which may cost around $50 extra out of pocket. If possible,  we recommend you accept the option to have the 3D mammogram performed.  It both reduces the number of women who will be called back for extra views which then turn out to be normal, and it is better than the routine mammogram at detecting truly abnormal areas.    COLONOSCOPY:  Colonoscopy to screen for colon cancer is recommended for all women at age 54.  We know, you hate the idea of the prep.  We agree, BUT, having colon cancer and not knowing it is worse!!  Colon cancer so often starts as a polyp that can be seen and removed at colonscopy, which can quite literally save your life!  And if your first colonoscopy is normal and you have no family history of colon cancer, most women don't have to have it again for 10 years.  Once every ten years, you can do something that may end up saving your life, right?  We will be happy to help you get it scheduled when you are ready.  Be sure to check your insurance coverage so you understand how much  it will cost.  It may be covered as a preventative service at no cost, but you should check your particular policy.      Breast Self-Awareness Breast self-awareness means being familiar with how your breasts look and feel. It involves checking your breasts regularly and reporting any changes to your health care provider. Practicing breast self-awareness is important. A change in your breasts can be a sign of a serious medical problem. Being familiar with how your breasts look and feel allows you to find any problems early, when treatment is more likely to be successful. All  women should practice breast self-awareness, including women who have had breast implants. How to do a breast self-exam One way to learn what is normal for your breasts and whether your breasts are changing is to do a breast self-exam. To do a breast self-exam: Look for Changes  1. Remove all the clothing above your waist. 2. Stand in front of a mirror in a room with good lighting. 3. Put your hands on your hips. 4. Push your hands firmly downward. 5. Compare your breasts in the mirror. Look for differences between them (asymmetry), such as: ? Differences in shape. ? Differences in size. ? Puckers, dips, and bumps in one breast and not the other. 6. Look at each breast for changes in your skin, such as: ? Redness. ? Scaly areas. 7. Look for changes in your nipples, such as: ? Discharge. ? Bleeding. ? Dimpling. ? Redness. ? A change in position. Feel for Changes  Carefully feel your breasts for lumps and changes. It is best to do this while lying on your back on the floor and again while sitting or standing in the shower or tub with soapy water on your skin. Feel each breast in the following way:  Place the arm on the side of the breast you are examining above your head.  Feel your breast with the other hand.  Start in the nipple area and make  inch (2 cm) overlapping circles to feel your breast. Use the pads of your three middle fingers to do this. Apply light pressure, then medium pressure, then firm pressure. The light pressure will allow you to feel the tissue closest to the skin. The medium pressure will allow you to feel the tissue that is a little deeper. The firm pressure will allow you to feel the tissue close to the ribs.  Continue the overlapping circles, moving downward over the breast until you feel your ribs below your breast.  Move one finger-width toward the center of the body. Continue to use the  inch (2 cm) overlapping circles to feel your breast as you move  slowly up toward your collarbone.  Continue the up and down exam using all three pressures until you reach your armpit.  Write Down What You Find  Write down what is normal for each breast and any changes that you find. Keep a written record with breast changes or normal findings for each breast. By writing this information down, you do not need to depend only on memory for size, tenderness, or location. Write down where you are in your menstrual cycle, if you are still menstruating. If you are having trouble noticing differences in your breasts, do not get discouraged. With time you will become more familiar with the variations in your breasts and more comfortable with the exam. How often should I examine my breasts? Examine your breasts every month. If you are breastfeeding, the best time to  examine your breasts is after a feeding or after using a breast pump. If you menstruate, the best time to examine your breasts is 5-7 days after your period is over. During your period, your breasts are lumpier, and it may be more difficult to notice changes. When should I see my health care provider? See your health care provider if you notice:  A change in shape or size of your breasts or nipples.  A change in the skin of your breast or nipples, such as a reddened or scaly area.  Unusual discharge from your nipples.  A lump or thick area that was not there before.  Pain in your breasts.  Anything that concerns you.  This information is not intended to replace advice given to you by your health care provider. Make sure you discuss any questions you have with your health care provider. Document Released: 05/27/2005 Document Revised: 11/02/2015 Document Reviewed: 04/16/2015 Elsevier Interactive Patient Education  [employer].

## 2017-12-19 ENCOUNTER — Encounter: Payer: Self-pay | Admitting: Genetic Counselor

## 2017-12-19 ENCOUNTER — Telehealth: Payer: Self-pay | Admitting: Genetic Counselor

## 2017-12-19 NOTE — Telephone Encounter (Signed)
New genetic counseling referral received from Dr. Talbert Nan for fhx of breast cancer. Pt has been scheduled to see Katherine Ritter on 7/22 at 8am. Pt aware to arrive 30 minutes early. Letter mailed to the pt.

## 2017-12-22 ENCOUNTER — Encounter: Payer: Self-pay | Admitting: Licensed Clinical Social Worker

## 2017-12-22 ENCOUNTER — Telehealth: Payer: Self-pay | Admitting: Licensed Clinical Social Worker

## 2017-12-22 NOTE — Telephone Encounter (Signed)
Pt's genetic counseling appointment has been rescheduled for the pt to see Epimenio Foot on 7/22 at 9am. Pt has agreed to the new appt date and time. Letter mailed.

## 2017-12-29 ENCOUNTER — Encounter: Payer: Commercial Managed Care - PPO | Admitting: Genetic Counselor

## 2017-12-29 ENCOUNTER — Inpatient Hospital Stay: Payer: Commercial Managed Care - PPO

## 2017-12-29 ENCOUNTER — Encounter: Payer: Self-pay | Admitting: Licensed Clinical Social Worker

## 2017-12-29 ENCOUNTER — Other Ambulatory Visit: Payer: Commercial Managed Care - PPO

## 2017-12-29 ENCOUNTER — Inpatient Hospital Stay: Payer: Commercial Managed Care - PPO | Attending: Genetic Counselor | Admitting: Licensed Clinical Social Worker

## 2017-12-29 DIAGNOSIS — Z803 Family history of malignant neoplasm of breast: Secondary | ICD-10-CM | POA: Diagnosis not present

## 2017-12-29 DIAGNOSIS — Z85828 Personal history of other malignant neoplasm of skin: Secondary | ICD-10-CM | POA: Insufficient documentation

## 2017-12-29 DIAGNOSIS — Z800 Family history of malignant neoplasm of digestive organs: Secondary | ICD-10-CM

## 2017-12-29 DIAGNOSIS — Z801 Family history of malignant neoplasm of trachea, bronchus and lung: Secondary | ICD-10-CM | POA: Diagnosis not present

## 2017-12-29 DIAGNOSIS — Z7183 Encounter for nonprocreative genetic counseling: Secondary | ICD-10-CM

## 2017-12-29 NOTE — Progress Notes (Signed)
REFERRING PROVIDER: Salvadore Ritter, [address] [place], [place] [address]  PRIMARY PROVIDER:  Cher Nakai, MD  PRIMARY REASON FOR VISIT:  1. Family history of breast cancer   2. Family history of throat cancer   3. Personal history of skin cancer   4. Family history of lung cancer      HISTORY OF PRESENT ILLNESS:   Katherine Ritter, a 52 y.o. female, was seen for a [facility] cancer genetics consultation at the request of Katherine Ritter due to a family history of cancer.  Katherine Ritter presents to clinic today to discuss the possibility of a hereditary predisposition to cancer, genetic testing, and to further clarify her future cancer risks, as well as potential cancer risks for family members.   Katherine Ritter is a 52 y.o. female with a history of basal cell carcinoma, otherwise no history of cancer.  HORMONAL RISK FACTORS:  Menarche was at age 64.  First live birth at age 32.  OCP use for approximately 10 years.  Ovaries intact: yes.  Hysterectomy: no.  Menopausal status: postmenopausal.  HRT use: 0 years. Colonoscopy: yes; 3 polyps on her first colonoscopy at age 52, none on her second. She has them every 5 years. Mammogram within the last year: yes. Number of breast biopsies: 1.  Past Medical History:  Diagnosis Date  . Acute allergic rhinitis   . Anemia   . Asthma   . BMI 24.0-24.9, adult   . Dizziness    "I've had inner ear before"  . Family history of breast cancer   . Family history of lung cancer   . Family history of throat cancer   . Personal history of skin cancer   . Syncope     Past Surgical History:  Procedure Laterality Date  . BREAST BIOPSY Right   . skin cancer removal     x4    Social History   Socioeconomic History  . Marital status: Significant Other    Spouse name: Not on file  . Number of children: 2  . Years of education: Not on file  . Highest education level: Associate degree: occupational, Hotel manager, or vocational  program  Occupational History  . Not on file  Social Needs  . Financial resource strain: Not on file  . Food insecurity:    Worry: Not on file    Inability: Not on file  . Transportation needs:    Medical: Not on file    Non-medical: Not on file  Tobacco Use  . Smoking status: Never Smoker  . Smokeless tobacco: Never Used  Substance and Sexual Activity  . Alcohol use: Yes    Alcohol/week: 0.6 - 1.8 oz    Types: 1 - 3 Standard drinks or equivalent per week  . Drug use: Never  . Sexual activity: Yes    Partners: Male    Birth control/protection: Condom  Lifestyle  . Physical activity:    Days per week: Not on file    Minutes per session: Not on file  . Stress: Not on file  Relationships  . Social connections:    Talks on phone: Not on file    Gets together: Not on file    Attends religious service: Not on file    Active member of club or organization: Not on file    Attends meetings of clubs or organizations: Not on file    Relationship status: Not on file  Other Topics Concern  . Not on  file  Social History Narrative   Lives with children & fiance   Right handed   Caffeine: 1-3 cups a week     FAMILY HISTORY:   We obtained a detailed, 4-generation family history.  Significant diagnoses are listed below: Family History  Problem Relation Age of Onset  . Lung cancer Mother   . Heart attack Mother   . Throat cancer Father   . Dementia Father   . Heart disease Father   . Stroke Father   . Breast cancer Paternal Aunt 27  . Thyroid disease Paternal Aunt   . Breast cancer Paternal Grandmother 22  . Dementia Maternal Grandmother   . Emphysema Maternal Grandfather   . Heart attack Paternal Grandfather   . Breast cancer Other 17       paternal great aunts x5  . Breast cancer Cousin 94       second cousin  . Seizures Neg Hx    Katherine Ritter has two sons, ages 65 and 43. Neither have children. Katherine Ritter has a brother age 49 who has two daughters and a son, and a  sister age 82 who has a son and a daughter. No cancer history for any of these family members.  Katherine Ritter father has a history of throat cancer diagnosed at age 75. He is living at age 20 with dementia. Katherine Ritter reports that she is taking care of both her father and mother right now. Katherine Ritter father had one sister and 3 brothers. His sister was diagnosed with breast cancer at age 16 and lived to be 15. She has two daughters and a son, none with cancer history. Katherine Ritter paternal uncles have all passed away from Parkinson's/dementia. They all have children, no cancer history. Katherine Ritter paternal grandfather died of a heart attack at 9. Katherine Ritter paternal grandmother was diagnosed with breast cancer at age 31 and lived to her 84s. Her grandmother had "at least 5" sisters all diagnosed with breast cancer in their 78's. One of these sisters has a son who has a daughter (Katherine Ritter second cousin) who was diagnosed with breast cancer at "72-50." She is living at age 84. Katherine Ritter does not know if she had genetic testing.  Katherine Ritter mother was diagnosed with lung cancer at 23 and is currently living at 95. She has 3 brothers and a sister. All of these siblings have children. No cancer history in any of these family members. Katherine Ritter maternal grandfather died of emphysema at 45 and her maternal grandmother died of dementia at 58.   Katherine Ritter is unaware of previous family history of genetic testing for hereditary cancer risks. Patient's maternal ancestors are of Caucasian descent, and paternal ancestors are of Caucasian descent. There is no reported Ashkenazi Jewish ancestry. There is no known consanguinity.  GENETIC COUNSELING ASSESSMENT: Katherine Ritter is a 52 y.o. female with a family history which is somewhat suggestive of a Hereditary Cancer Predisposition Syndrome. We, therefore, discussed and recommended the following at today's visit.   DISCUSSION: We reviewed the  characteristics, features and inheritance patterns of hereditary cancer syndromes. We also discussed genetic testing, including the appropriate family members to test, the process of testing, insurance coverage and turn-around-time for results. We discussed the implications of a negative, positive and/or variant of uncertain significant result. We recommended Katherine Ritter pursue genetic testing for the [facility] Common Hereditary Cancer 47 gene panel.   The Common Hereditary Cancer Panel offered by [facility] includes sequencing  and/or deletion duplication testing of the following 47 genes: APC, ATM, AXIN2, BARD1, BMPR1A, BRCA1, BRCA2, BRIP1, CDH1, CDKN2A (p14ARF), CDKN2A (p16INK4a), CKD4, CHEK2, CTNNA1, DICER1, EPCAM (Deletion/duplication testing only), GREM1 (promoter region deletion/duplication testing only), KIT, MEN1, MLH1, MSH2, MSH3, MSH6, MUTYH, NBN, NF1, NHTL1, PALB2, PDGFRA, PMS2, POLD1, POLE, PTEN, RAD50, RAD51C, RAD51D, SDHB, SDHC, SDHD, SMAD4, SMARCA4. STK11, TP53, TSC1, TSC2, and VHL.  The following genes were evaluated for sequence changes only: SDHA and HOXB13 c.251G>A variant only.  We discussed that only 5-10% of cancers are associated with a Hereditary cancer predisposition syndrome.  One of the most common hereditary cancer syndromes that increases breast cancer risk is called Hereditary Breast and Ovarian Cancer (HBOC) syndrome.  This syndrome is caused by mutations in the BRCA1 and BRCA2 genes.  This syndrome increases an individual's lifetime risk to develop breast, ovarian, pancreatic, and other types of cancer.  There are also many other cancer predisposition syndromes caused by mutations in several other genes.  We discussed that if she is found to have a mutation in one of these genes, it may impact future medical management recommendations such as increased cancer screenings and consideration of risk reducing surgeries.  A positive result could also have implications for the patient's  family members.  A Negative result would mean we were unable to identify a hereditary component to her cancer, but does not rule out the possibility of a hereditary basis for her family's history of  breast cancer.  There could be mutations that are undetectable by current technology, or in genes not yet tested or identified to increase cancer risk.    We discussed the potential to find a Variant of Uncertain Significance or VUS.  These are variants that have not yet been identified as pathogenic or benign, and it is unknown if this variant is associated with increased cancer risk or if this is a normal finding.  Most VUS's are reclassified to benign or likely benign.   It should not be used to make medical management decisions. With time, we suspect the lab will determine the significance of any VUS's identified if any.   Based on Katherine Ritter family history of cancer, she meets medical criteria for genetic testing. Despite that she meets criteria, she may still have an out of pocket cost.  We discussed that some people do not want to undergo genetic testing due to fear of genetic discrimination.  A federal law called the Genetic Information Non-Discrimination Act (GINA) of 2008 helps protect individuals against genetic discrimination based on their genetic test results.  It impacts both health insurance and employment.  For health insurance, it protects against increased premiums, being kicked off insurance or being forced to take a test in order to be insured.  For employment it protects against hiring, firing and promoting decisions based on genetic test results.  Health status due to a cancer diagnosis is not protected under GINA.  This law does not protect life insurance, disability insurance, or other types of insurance.   Based on the patient's family history, the statistical model Tyrer-Cuzick was used to estimate her risk of developing breast cancer. This estimates her lifetime risk of  developing breast cancer to be approximately 16.9%. This estimation does not take into account any genetic testing results.  The patient's lifetime breast cancer risk is a preliminary estimate based on available information using one of several models endorsed by the [facility] (ACS). The ACS recommends consideration of breast MRI screening as an  adjunct to mammography for patients at high risk (defined as 20% or greater lifetime risk). A more detailed breast cancer risk assessment can be considered, if clinically indicated.      PLAN: After considering the risks, benefits, and limitations, Ms. Stiver  provided informed consent to pursue genetic testing and the blood sample was sent to [facility] for analysis of the Comprehensive Cancer Panel. Results should be available within approximately 2-3 weeks' time, at which point they will be disclosed by telephone to Ms. Patlan, as will any additional recommendations warranted by these results. Ms. Whipple will receive a summary of her genetic counseling visit and a copy of her results once available. This information will also be available in Epic. We encouraged Ms. Suire to remain in contact with cancer genetics annually so that we can continuously update the family history and inform her of any changes in cancer genetics and testing that may be of benefit for her family. Ms. Kertesz questions were answered to her satisfaction today. Our contact information was provided should additional questions or concerns arise.  Based on Ms. Barletta family history, we recommended her second cousin, who was diagnosed with breast cancer at age "30-50", have genetic counseling and testing. Ms. Hammes will let us know if we can be of any assistance in coordinating genetic counseling and/or testing for this family member.   Lastly, we encouraged Ms. Melnik to remain in contact with cancer genetics annually so that we can continuously update the  family history and inform her of any changes in cancer genetics and testing that may be of benefit for this family.   Ms.  Jonsson questions were answered to her satisfaction today. Our contact information was provided should additional questions or concerns arise. Thank you for the referral and allowing [place] to share in the care of your patient.   Epimenio Foot, MS Genetic Counselor [email or web address] Phone: [phone]  The patient was seen for a total of 35 minutes in face-to-face genetic counseling. This patient was discussed with Drs. Magrinat, Lindi Adie and/or Burr Medico who agrees with the above.

## 2017-12-31 ENCOUNTER — Ambulatory Visit (INDEPENDENT_AMBULATORY_CARE_PROVIDER_SITE_OTHER): Payer: Commercial Managed Care - PPO | Admitting: Cardiology

## 2017-12-31 VITALS — BP 114/64 | HR 76 | Ht 65.0 in | Wt 139.6 lb

## 2017-12-31 DIAGNOSIS — R569 Unspecified convulsions: Secondary | ICD-10-CM | POA: Diagnosis not present

## 2017-12-31 DIAGNOSIS — R55 Syncope and collapse: Secondary | ICD-10-CM

## 2017-12-31 NOTE — Patient Instructions (Signed)
Medication Instructions:  Your physician recommends that you continue on your current medications as directed. Please refer to the Current Medication list given to you today.  Labwork: None  Testing/Procedures: None  Follow-Up: Your physician recommends that you schedule a follow-up appointment in: 6 weeks  Any Other Special Instructions Will Be Listed Below (If Applicable).     If you need a refill on your cardiac medications before your next appointment, please call your pharmacy.   [place], RN, BSN

## 2017-12-31 NOTE — Progress Notes (Signed)
Cardiology Office Note:    Date:  12/31/2017   ID:  Katherine Ritter, DOB 05-19-1966, MRN [id number]  PCP:  Cher Nakai, MD  Cardiologist:  Jenne Campus, MD    Referring MD: Cher Nakai, MD   Chief Complaint  Patient presents with  . 6 week follow up  Doing well with episode of syncope.  Interestingly she also had a EEG done which was not normal  History of Present Illness:    Katherine Ritter is a 52 y.o. female she got quite extensive evaluation done so far and everything seems to be negative cardiac wise she did have echocardiogram which was normal stress test which was normal she wear event recorder which showed no arrhythmia she denies have any more episodes.  She been seen by neurology.  And it looks like long-term EEG is planned.  Will wait for results of the test.  Past Medical History:  Diagnosis Date  . Acute allergic rhinitis   . Anemia   . Asthma   . BMI 24.0-24.9, adult   . Dizziness    "I've had inner ear before"  . Family history of breast cancer   . Family history of lung cancer   . Family history of throat cancer   . Personal history of skin cancer   . Syncope     Past Surgical History:  Procedure Laterality Date  . BREAST BIOPSY Right   . skin cancer removal     x4    Current Medications: No outpatient medications have been marked as taking for the 12/31/17 encounter (Office Visit) with Park Liter, MD.     Allergies:   Patient has no known allergies.   Social History   Socioeconomic History  . Marital status: Significant Other    Spouse name: Not on file  . Number of children: 2  . Years of education: Not on file  . Highest education level: Associate degree: occupational, Hotel manager, or vocational program  Occupational History  . Not on file  Social Needs  . Financial resource strain: Not on file  . Food insecurity:    Worry: Not on file    Inability: Not on file  . Transportation needs:    Medical: Not on file    Non-medical:  Not on file  Tobacco Use  . Smoking status: Never Smoker  . Smokeless tobacco: Never Used  Substance and Sexual Activity  . Alcohol use: Yes    Alcohol/week: 0.6 - 1.8 oz    Types: 1 - 3 Standard drinks or equivalent per week  . Drug use: Never  . Sexual activity: Yes    Partners: Male    Birth control/protection: Condom  Lifestyle  . Physical activity:    Days per week: Not on file    Minutes per session: Not on file  . Stress: Not on file  Relationships  . Social connections:    Talks on phone: Not on file    Gets together: Not on file    Attends religious service: Not on file    Active member of club or organization: Not on file    Attends meetings of clubs or organizations: Not on file    Relationship status: Not on file  Other Topics Concern  . Not on file  Social History Narrative   Lives with children & fiance   Right handed   Caffeine: 1-3 cups a week     Family History: The patient's family history includes Breast cancer (age  of onset: 71) in her cousin; Breast cancer (age of onset: 91) in her other, paternal aunt, and paternal grandmother; Dementia in her father and maternal grandmother; Emphysema in her maternal grandfather; Heart attack in her mother and paternal grandfather; Heart disease in her father; Lung cancer in her mother; Stroke in her father; Throat cancer in her father; Thyroid disease in her paternal aunt. There is no history of Seizures. ROS:   Please see the history of present illness.    All 14 point review of systems negative except as described per history of present illness  EKGs/Labs/Other Studies Reviewed:      Recent Labs: 11/18/2017: ALT 16; BUN 15; Creatinine, Ser 0.72; Hemoglobin 14.3; Platelets 252; Potassium 4.6; Sodium 144; TSH 2.280  Recent Lipid Panel    Component Value Date/Time   CHOL 188 12/16/2016 1508   TRIG 59 12/16/2016 1508   HDL 68 12/16/2016 1508   CHOLHDL 2.8 12/16/2016 1508   LDLCALC 108 (H) 12/16/2016 1508     Physical Exam:    VS:  BP 114/64   Pulse 76   Ht 5\' 5"  (1.651 m)   Wt 139 lb 9.6 oz (63.3 kg)   LMP 12/01/2011   SpO2 99%   BMI 23.23 kg/m     Wt Readings from Last 3 Encounters:  12/31/17 139 lb 9.6 oz (63.3 kg)  12/17/17 140 lb 9.6 oz (63.8 kg)  11/18/17 142 lb (64.4 kg)     GEN:  Well nourished, well developed in no acute distress HEENT: Normal NECK: No JVD; No carotid bruits LYMPHATICS: No lymphadenopathy CARDIAC: RRR, no murmurs, no rubs, no gallops RESPIRATORY:  Clear to auscultation without rales, wheezing or rhonchi  ABDOMEN: Soft, non-tender, non-distended MUSCULOSKELETAL:  No edema; No deformity  SKIN: Warm and dry LOWER EXTREMITIES: no swelling NEUROLOGIC:  Alert and oriented x 3 PSYCHIATRIC:  Normal affect   ASSESSMENT:    1. Syncope, unspecified syncope type   2. Seizure ([place])    PLAN:    In order of problems listed above:  1. Syncope with some abnormal EEG.  Work-up being done by neurology.  From cardiac standpoint review I do not see any potential explanation from cardiac standpoint review her symptoms however I told her in the future if neurology work-up will be negative we may proceed even with EP evaluation.  Therefore I would like to see her back in my office in about 6 weeks or sooner if she get a problem.   Medication Adjustments/Labs and Tests Ordered: Current medicines are reviewed at length with the patient today.  Concerns regarding medicines are outlined above.  No orders of the defined types were placed in this encounter.  Medication changes: No orders of the defined types were placed in this encounter.   Signed, Park Liter, MD, [facility] 12/31/2017 11:07 AM    [place]

## 2018-01-07 ENCOUNTER — Telehealth: Payer: Self-pay | Admitting: Licensed Clinical Social Worker

## 2018-01-07 NOTE — Telephone Encounter (Signed)
Delivered positive genetic test result to Katherine Ritter. Offered a follow-up appointment which she declined at this time.

## 2018-01-08 ENCOUNTER — Ambulatory Visit: Payer: Self-pay | Admitting: Licensed Clinical Social Worker

## 2018-01-08 ENCOUNTER — Encounter: Payer: Self-pay | Admitting: Licensed Clinical Social Worker

## 2018-01-08 NOTE — Progress Notes (Signed)
GENETIC TEST RESULTS   Patient Name: Katherine Ritter Patient Age: 52 y.o. Encounter Date: 01/08/2018  Referring Provider: Salvadore Dom, [address] [place], [place] [address]    Ms. Castleman was seen in the [facility] on 12/29/2017 due to a family history of cancer and concern regarding a hereditary predisposition to cancer in the family. Please refer to the prior Genetics clinic note for more information regarding Ms. Lamere medical and family histories and our assessment at the time.   FAMILY HISTORY:  We obtained a detailed, 4-generation family history.  Significant diagnoses are listed below: Family History  Problem Relation Age of Onset  . Lung cancer Mother   . Heart attack Mother   . Throat cancer Father   . Dementia Father   . Heart disease Father   . Stroke Father   . Breast cancer Paternal Aunt 30  . Thyroid disease Paternal Aunt   . Breast cancer Paternal Grandmother 61  . Dementia Maternal Grandmother   . Emphysema Maternal Grandfather   . Heart attack Paternal Grandfather   . Breast cancer Other 89       paternal great aunts x5  . Breast cancer Cousin 45       second cousin  . Seizures Neg Hx     Ms. Hackley has two sons, ages 26 and 2. Neither have children. Ms. Soltis has a brother age 54 who has two daughters and a son, and a sister age 9 who has a son and a daughter. No cancer history for any of these family members.  Ms. Friesenhahn father has a history of throat cancer diagnosed at age 1. He is living at age 18 with dementia. Ms. Steeves reports that she is taking care of both her father and mother right now. Ms. Scearce father had one sister and 3 brothers. His sister was diagnosed with breast cancer at age 83 and lived to be 4. She has two daughters and a son, none with cancer history. Ms. Delamora paternal uncles have all passed away from Parkinson's/dementia. They all have children, no cancer history. Ms.  Seib paternal grandfather died of a heart attack at 70. Ms. Madole paternal grandmother was diagnosed with breast cancer at age 102 and lived to her 30s. Her grandmother had "at least 5" sisters all diagnosed with breast cancer in their 74's. One of these sisters has a son who has a daughter (Ms. Lorino second cousin) who was diagnosed with breast cancer at "47-50." She is living at age 43. Ms. Mcquade does not know if she had genetic testing.  Ms. Vanasten mother was diagnosed with lung cancer at 67 and is currently living at 66. She has 3 brothers and a sister. All of these siblings have children. No cancer history in any of these family members. Ms. Woodham maternal grandfather died of emphysema at 53 and her maternal grandmother died of dementia at 54.   Ms. Mccroskey is unaware of previous family history of genetic testing for hereditary cancer risks. Patient's maternal ancestors are of Caucasian descent, and paternal ancestors are of Caucasian descent. There is no reported Ashkenazi Jewish ancestry. There is no known consanguinity.  GENETIC TESTING:  At the time of Ms. Zwilling visit, we recommended she pursue genetic testing of the [facility] Common Hereditary Cancers Panel test. 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 There were no deleterious mutations in the 46 other genes: APC,  ATM, AXIN2, BARD1, BMPR1A, BRCA1, BRCA2, BRIP1, CDH1, CDKN2A (p14ARF), CDKN2A (p16INK4a), CKD4, CHEK2, CTNNA1, DICER1, EPCAM (Deletion/duplication testing only), GREM1 (promoter region deletion/duplication testing only), KIT, MEN1, MLH1, MSH2, MSH3, MSH6, MUTYH, NBN, NF1, NHTL1, PALB2, PDGFRA, PMS2, POLD1, POLE, PTEN, RAD51C, RAD51D, SDHB, SDHC, SDHD, SMAD4, SMARCA4. STK11, TP53, TSC1, TSC2, and VHL.  The following genes were evaluated for sequence changes only: SDHA and HOXB13 c.251G>A variant only.    RAD50 GENE The RAD50  gene is a component of the MRN complex, which is a protein complex consisting of the MRE11A, RAD50, and NBN genes. This complex plays a central role in double-strand break repair, DNA recombination, and maintenance of telomere integrity and meiosis (PMID: 35465681; UniProtKB - E75170 RAD50_HUMAN; Accessed July 2016. If there is a pathogenic variant in this gene that prevents it from functioning normally, the risk of developing certain types of cancers is increased.  CLINICAL CONDITION Women who are carriers of a single pathogenic RAD50 variant have an increased risk of breast cancer, although exact risk figures have yet to be determined (PMID: 01749449, 67591638, 46659935). Data also associate the RAD50 gene with ovarian cancer; however, these data are limited (PMID: 70177939). An elevated risk for other cancers has been considered, but available evidence is insufficient to make a determination at this time (PMID: 03009233, 00762263, 33545625). While an individual with a RAD50 pathogenic variant will not necessarily develop cancer in her lifetime, her cancer risk is increased when compared to the general population risk.  INHERITANCE Individuals with a single pathogenic variant in RAD50 have a 50% chance of passing that variant on to their offspring. It is now possible to identify relatives who can pursue testing for this specific familial variant. Many cases are inherited from a parent, but some cases occur spontaneously (i.e., an individual with a pathogenic variant has parents who do not have it).  Limited evidence also supports a correlation between the RAD50 gene and autosomal recessive Nijmegen breakage syndrome-like disorder (NBSLD). NBSLD is characterized by a clinical phenotype resembling Nijmegen breakage syndrome including chromosomal instability, radiosensitivity, neurodevelopmental disease, and immunodeficiency, but with a milder clinical presentation (PMID: 63893734).  MANAGEMENT At this  time, there are no published guidelines or recommendations suggesting RAD50-specific medical management. However, due to the associated increased risk of breast cancer in women with a pathogenic RAD50 variant, enhanced or more frequent cancer screening may be warranted. An individual's cancer risk and medical management are not determined by genetic test results alone. Overall cancer risk assessment incorporates additional factors, including personal medical history, family history, and any available genetic information that may result in a personalized plan for cancer prevention and surveillance.  Even though data regarding pathogenic RAD50 are limited and the associated risk of breast and ovarian cancer is currently emerging, knowing if a pathogenic variant is present is advantageous. At-risk relatives can be identified, enabling pursuit of a diagnostic evaluation. Further, the available information regarding hereditary cancer susceptibility genes is constantly evolving and more clinically relevant data regarding RAD50 are likely to become available in the near future. Awareness of this cancer predisposition encourages patients and their providers to inform at-risk family members, diligently follow standard screening protocols, and be vigilant in maintaining close and regular contact with their local genetics clinic in anticipation of new information.  Based on the patient's personal and family history, the statistical model Harriett Rush was used to estimate her risk of developing breast cancer. This estimates her baseline lifetime risk of developing breast cancer to be approximately 16.9%. This  estimation does NOT take into account any genetic testing results, and it is likely an underestimate due to her positive genetic test. Currently there are no risk models that take into account a positive RAD50 result. The patient's lifetime breast cancer risk is a preliminary estimate based on available information  using one of several models endorsed by the [place] (ACS). The ACS recommends consideration of breast MRI screening as an adjunct to mammography for patients at high risk (defined as 20% or greater lifetime risk).     FAMILY MEMBERS:  It is important that all of Ms. Hogston relatives (both men and women) know of the presence of this gene mutation. Site-specific genetic testing can sort out who in the family is at risk and who is not.   Ms. Keeran children and siblings have a 50% chance to have inherited this mutation. We recommend they have genetic testing for this same mutation, as identifying the presence of this mutation would allow them to also take advantage of risk-reducing measures.    SUPPORT AND RESOURCES:  If Ms. Deakins is interested in hereditary cancer information and support, there are two groups, Facing Our Risk (www.facingourrisk.com) and Bright Pink (www.brightpink.org) which some people have found useful. They provide opportunities to speak with other individuals from high-risk families. To locate genetic counselors in other cities, visit the website of the [employer] (ArtistMovie.se) and Secretary/administrator for a Social worker by zip code.  We encouraged Ms. Yono to remain in contact with [place] on an annual basis so we can update her personal and family histories, and let her know of advances in cancer genetics that may benefit the family. Our contact number was provided. Ms. Dirks questions were answered to her satisfaction today, and she knows she is welcome to call anytime with additional questions.   Epimenio Foot, MS Genetic Counselor [email or web address] Phone: [phone]

## 2018-01-13 ENCOUNTER — Ambulatory Visit: Payer: Commercial Managed Care - PPO | Admitting: Neurology

## 2018-03-23 ENCOUNTER — Ambulatory Visit: Payer: Commercial Managed Care - PPO | Admitting: Nurse Practitioner

## 2018-03-25 NOTE — Telephone Encounter (Signed)
Spoke with [employer] @ [employer]. She stated that patient was considered unreachable. They left several messages. They have the same phone numbers as our office does for pt contact.

## 2018-07-02 ENCOUNTER — Other Ambulatory Visit: Payer: Self-pay | Admitting: Obstetrics and Gynecology

## 2018-07-02 DIAGNOSIS — Z1231 Encounter for screening mammogram for malignant neoplasm of breast: Secondary | ICD-10-CM

## 2018-07-14 ENCOUNTER — Encounter: Payer: Self-pay | Admitting: Gastroenterology

## 2018-07-24 ENCOUNTER — Ambulatory Visit
Admission: RE | Admit: 2018-07-24 | Discharge: 2018-07-24 | Disposition: A | Discharge status: Home / Self Care | Payer: Commercial Managed Care - PPO | Source: Ambulatory Visit | Attending: Obstetrics and Gynecology | Admitting: Obstetrics and Gynecology

## 2018-07-24 DIAGNOSIS — Z1231 Encounter for screening mammogram for malignant neoplasm of breast: Secondary | ICD-10-CM

## 2018-07-24 IMAGING — MG DIGITAL SCREENING BILATERAL MAMMOGRAM WITH TOMO AND CAD
8 series · 9 of 24 positions shown · non-contrast
Comparison: Previous exam(s).

CLINICAL DATA: Screening.

EXAM:
DIGITAL SCREENING BILATERAL MAMMOGRAM WITH TOMO AND CAD

[R MLO synth-2D]
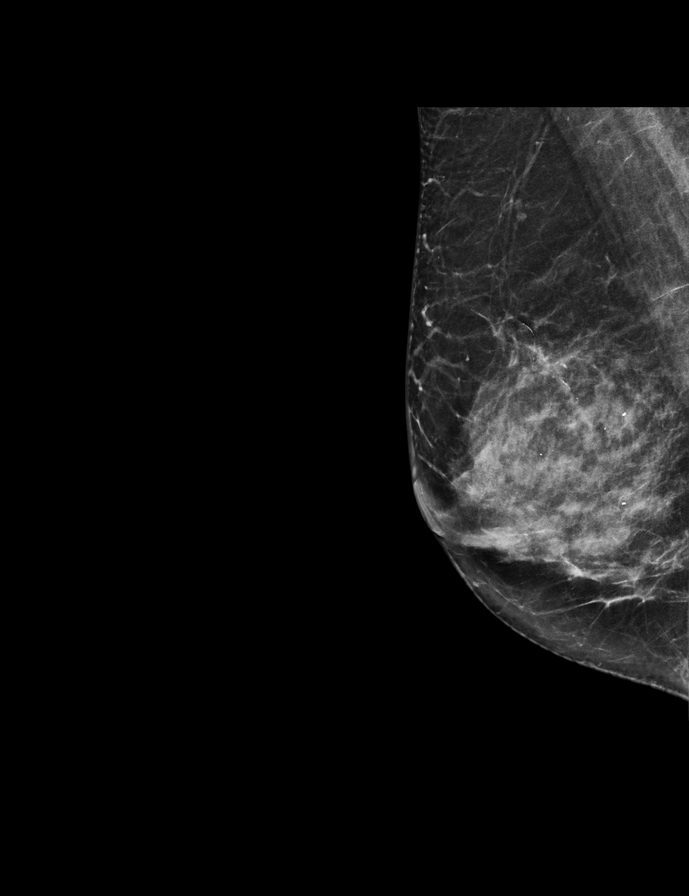

[R CC synth-2D]
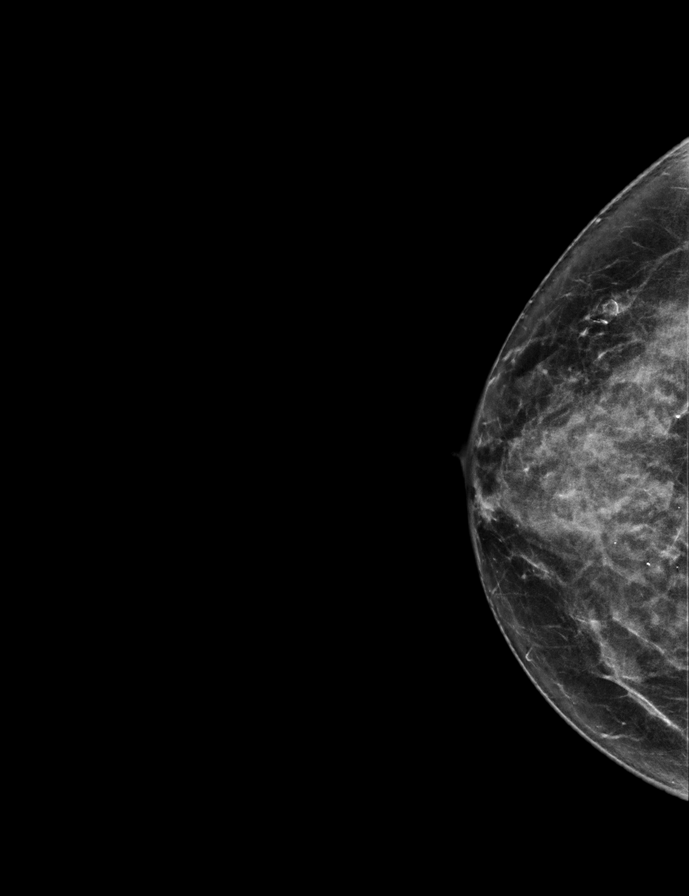

[L CC synth-2D]
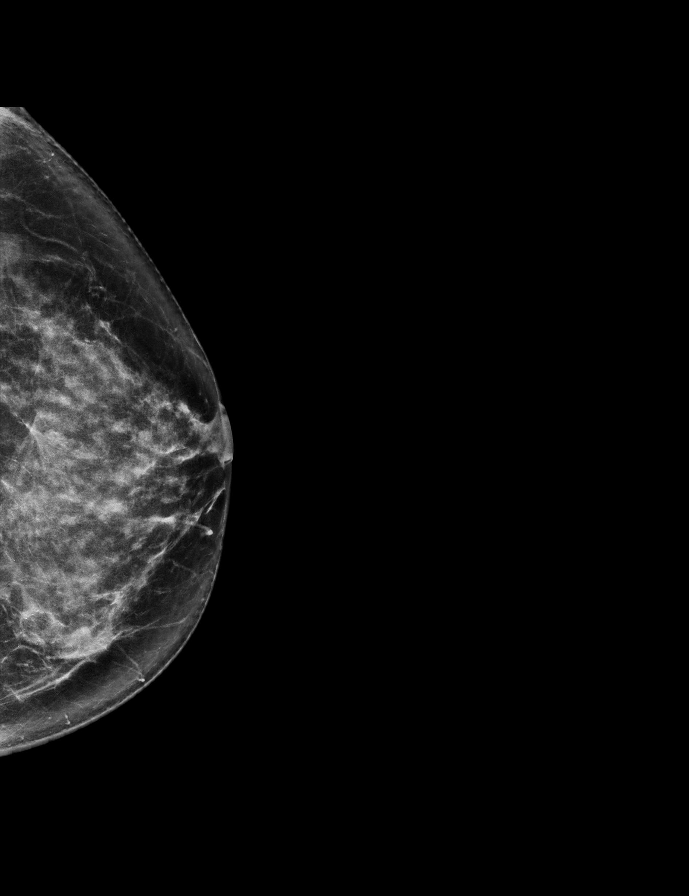

[L MLO synth-2D]
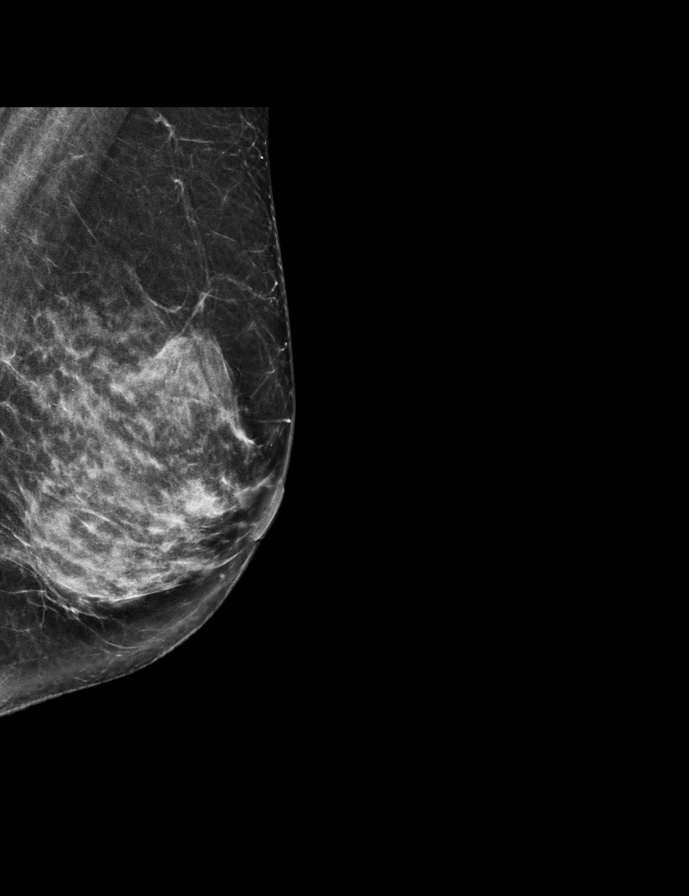

[L CC tomo · 2 of 64 frames shown]
[frame 21/64]
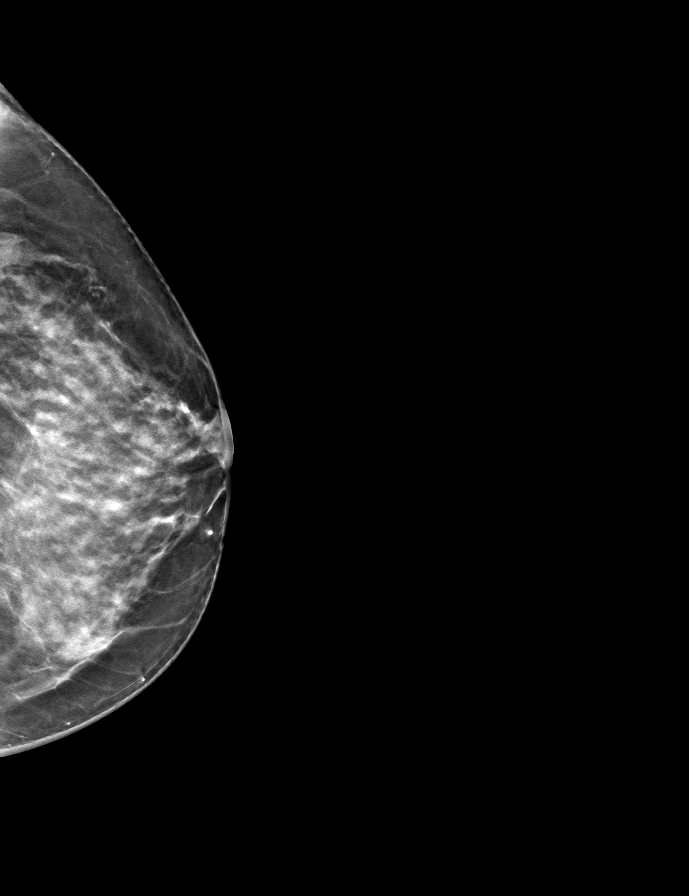
[frame 33/64]
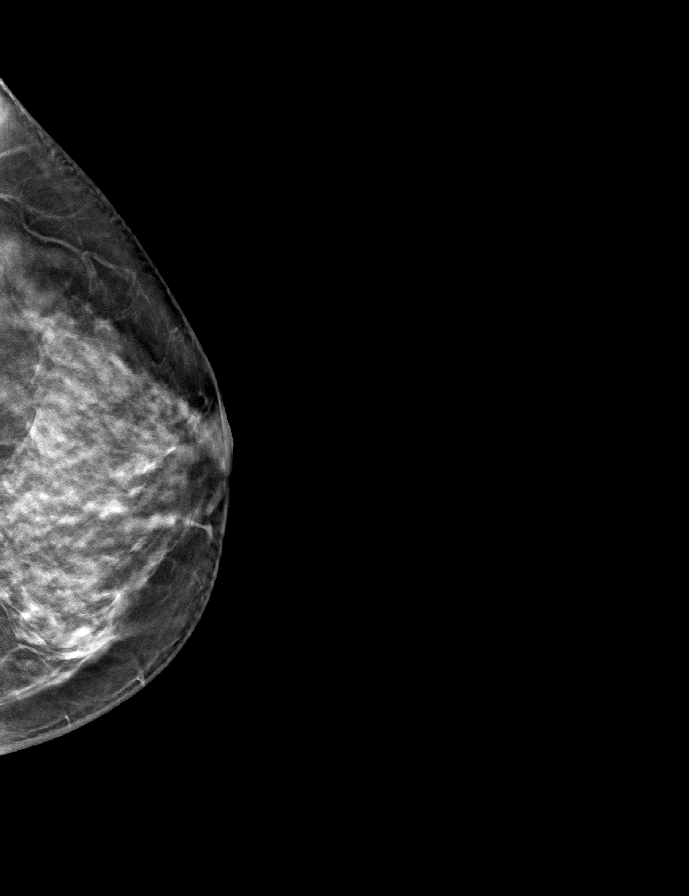

[R MLO tomo · tomo slice 31/60.0]
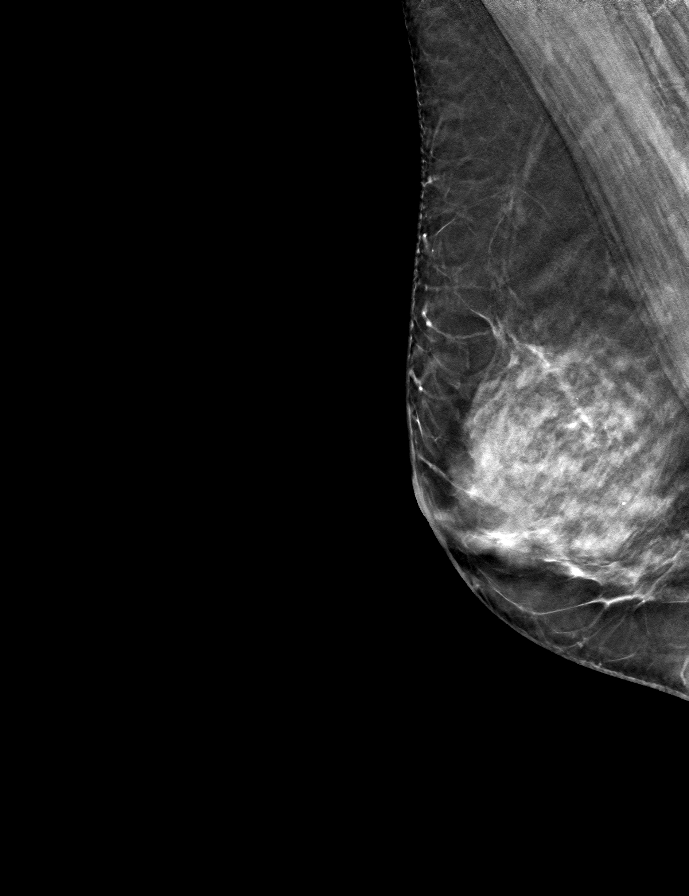

[R CC tomo · tomo slice 29/58.0]
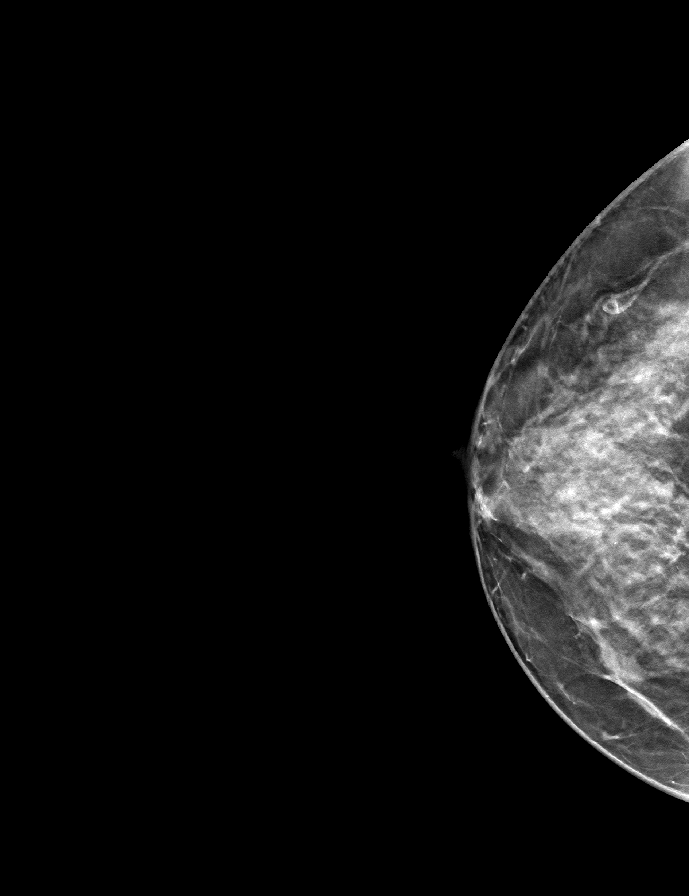

[L MLO tomo · tomo slice 33/64.0]
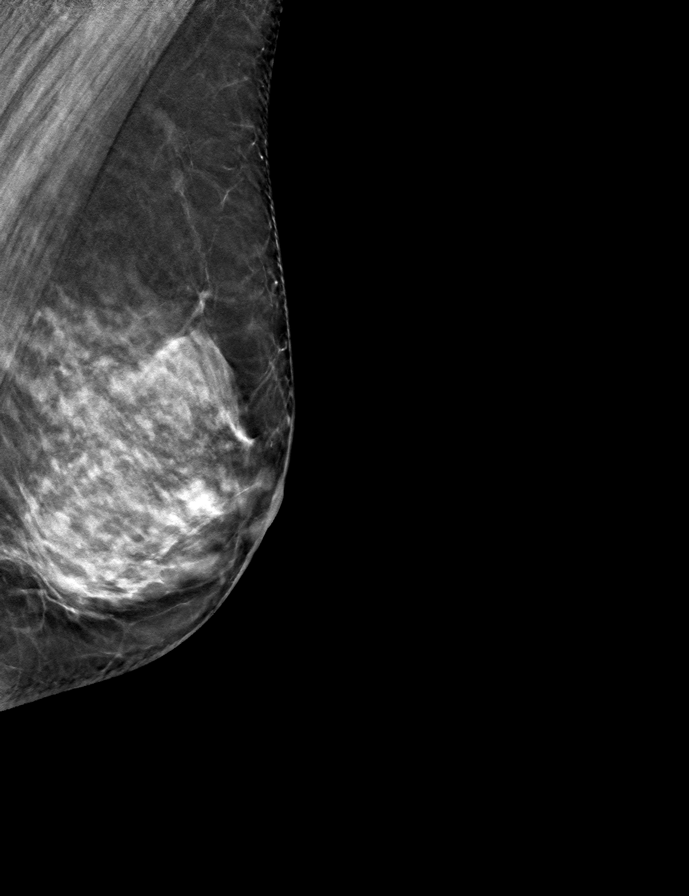

[9 of 24 positions shown; findings below may reference images not displayed]

ACR Breast Density Category c: The breast tissue is heterogeneously
dense, which may obscure small masses.
FINDINGS: There are no findings suspicious for malignancy. Images were
processed with CAD.
IMPRESSION: No mammographic evidence of malignancy. A result letter of this
screening mammogram will be mailed directly to the patient.

RECOMMENDATION:
Screening mammogram in one year. (Code:[5V])

BI-RADS CATEGORY  1: Negative.

## 2018-07-28 DIAGNOSIS — M5136 Other intervertebral disc degeneration, lumbar region: Secondary | ICD-10-CM | POA: Insufficient documentation

## 2018-08-05 ENCOUNTER — Ambulatory Visit (AMBULATORY_SURGERY_CENTER): Payer: Self-pay | Admitting: *Deleted

## 2018-08-05 ENCOUNTER — Encounter: Payer: Self-pay | Admitting: Gastroenterology

## 2018-08-05 VITALS — Ht 65.0 in | Wt 147.0 lb

## 2018-08-05 DIAGNOSIS — Z86010 Personal history of colonic polyps: Secondary | ICD-10-CM

## 2018-08-05 MED ORDER — SOD PICOSULFATE-MAG OX-CIT ACD 10-3.5-12 MG-GM -GM/160ML PO SOLN: 1.0000 | ORAL | 0 refills | Status: DC

## 2018-08-05 NOTE — Progress Notes (Signed)
Patient denies any allergies to eggs or soy. Patient denies any problems with anesthesia/sedation. Patient denies any oxygen use at home. Patient denies taking any diet/weight loss medications or blood thinners. EMMI education offered, pt declined. Clenpiq free coupon given to pt. Pt denies any seizures.

## 2018-08-19 ENCOUNTER — Encounter: Payer: Self-pay | Admitting: Gastroenterology

## 2018-08-19 ENCOUNTER — Other Ambulatory Visit: Payer: Self-pay

## 2018-08-19 ENCOUNTER — Ambulatory Visit (AMBULATORY_SURGERY_CENTER): Payer: Commercial Managed Care - PPO | Admitting: Gastroenterology

## 2018-08-19 VITALS — BP 91/59 | HR 64 | Temp 98.2°F | Resp 15 | Ht 65.0 in | Wt 139.0 lb

## 2018-08-19 DIAGNOSIS — D122 Benign neoplasm of ascending colon: Secondary | ICD-10-CM

## 2018-08-19 DIAGNOSIS — Z86010 Personal history of colonic polyps: Secondary | ICD-10-CM | POA: Diagnosis present

## 2018-08-19 MED ORDER — SODIUM CHLORIDE 0.9 % IV SOLN: 500.0000 mL | Freq: Once | INTRAVENOUS | Status: DC

## 2018-08-19 NOTE — Op Note (Signed)
[facility] Patient Name: Keisa Blow Procedure Date: 08/19/2018 9:17 AM MRN: [id number] Endoscopist: Jackquline Denmark , MD Age: 53 Referring MD:  Date of Birth: March 28, 1966 Gender: Female Account #: [id number] Procedure:                Colonoscopy Indications:              High risk colon cancer surveillance: Personal                            history of colonic polyps Medicines:                Monitored Anesthesia Care Procedure:                Pre-Anesthesia Assessment:                           - Prior to the procedure, a History and Physical                            was performed, and patient medications and                            allergies were reviewed. The patient's tolerance of                            previous anesthesia was also reviewed. The risks                            and benefits of the procedure and the sedation                            options and risks were discussed with the patient.                            All questions were answered, and informed consent                            was obtained. Prior Anticoagulants: The patient has                            taken no previous anticoagulant or antiplatelet                            agents. ASA Grade Assessment: I - A normal, healthy                            patient. After reviewing the risks and benefits,                            the patient was deemed in satisfactory condition to                            undergo the procedure.  After obtaining informed consent, the colonoscope                            was passed under direct vision. Throughout the                            procedure, the patient's blood pressure, pulse, and                            oxygen saturations were monitored continuously. The                            Colonoscope was introduced through the anus and                            advanced to the 2 cm into the ileum. The                     colonoscopy was performed without difficulty. The                            patient tolerated the procedure well. The quality                            of the bowel preparation was excellent. The                            terminal ileum, ileocecal valve, appendiceal                            orifice, and rectum were photographed. Scope In: 9:19:58 AM Scope Out: 9:31:37 AM Scope Withdrawal Time: 0 hours 7 minutes 23 seconds  Total Procedure Duration: 0 hours 11 minutes 39 seconds  Findings:                 A 6 mm polyp was found in the distal ascending                            colon. The polyp was sessile. The polyp was removed                            with a cold snare. Resection and retrieval were                            complete. Estimated blood loss: none.                           Non-bleeding internal hemorrhoids were found during                            retroflexion. The hemorrhoids were small.                           The terminal ileum appeared normal.  The exam was otherwise without abnormality on                            direct and retroflexion views. Complications:            No immediate complications. Estimated Blood Loss:     Estimated blood loss: none. Impression:               - One 6 mm polyp in the distal ascending colon,                            removed with a cold snare. Resected and retrieved.                           - Minimal internal hemorrhoids.                           - Otherwise normal to TI. Recommendation:           - Patient has a contact number available for                            emergencies. The signs and symptoms of potential                            delayed complications were discussed with the                            patient. Return to normal activities tomorrow.                            Written discharge instructions were provided to the                            patient.                            - Resume previous diet.                           - Continue present medications.                           - Await pathology results.                           - Repeat colonoscopy for surveillance based on                            pathology results.                           - Return to GI clinic PRN. Jackquline Denmark, MD 08/19/2018 9:36:33 AM This report has been signed electronically.

## 2018-08-19 NOTE — Patient Instructions (Signed)
Polyps handout given  YOU HAD AN ENDOSCOPIC PROCEDURE TODAY AT [facility]:   Refer to the procedure report that was given to you for any specific questions about what was found during the examination.  If the procedure report does not answer your questions, please call your gastroenterologist to clarify.  If you requested that your care partner not be given the details of your procedure findings, then the procedure report has been included in a sealed envelope for you to review at your convenience later.  YOU SHOULD EXPECT: Some feelings of bloating in the abdomen. Passage of more gas than usual.  Walking can help get rid of the air that was put into your GI tract during the procedure and reduce the bloating. If you had a lower endoscopy (such as a colonoscopy or flexible sigmoidoscopy) you may notice spotting of blood in your stool or on the toilet paper. If you underwent a bowel prep for your procedure, you may not have a normal bowel movement for a few days.  Please Note:  You might notice some irritation and congestion in your nose or some drainage.  This is from the oxygen used during your procedure.  There is no need for concern and it should clear up in a day or so.  SYMPTOMS TO REPORT IMMEDIATELY:   Following lower endoscopy (colonoscopy or flexible sigmoidoscopy):  Excessive amounts of blood in the stool  Significant tenderness or worsening of abdominal pains  Swelling of the abdomen that is new, acute  Fever of 100F or higher  For urgent or emergent issues, a gastroenterologist can be reached at any hour by calling [phone].   DIET:  We do recommend a small meal at first, but then you may proceed to your regular diet.  Drink plenty of fluids but you should avoid alcoholic beverages for 24 hours.  ACTIVITY:  You should plan to take it easy for the rest of today and you should NOT DRIVE or use heavy machinery until tomorrow (because of the sedation medicines  used during the test).    FOLLOW UP: Our staff will call the number listed on your records the next business day following your procedure to check on you and address any questions or concerns that you may have regarding the information given to you following your procedure. If we do not reach you, we will leave a message.  However, if you are feeling well and you are not experiencing any problems, there is no need to return our call.  We will assume that you have returned to your regular daily activities without incident.  If any biopsies were taken you will be contacted by phone or by letter within the next 1-3 weeks.  Please call us at [phone] if you have not heard about the biopsies in 3 weeks.    SIGNATURES/CONFIDENTIALITY: You and/or your care partner have signed paperwork which will be entered into your electronic medical record.  These signatures attest to the fact that that the information above on your After Visit Summary has been reviewed and is understood.  Full responsibility of the confidentiality of this discharge information lies with you and/or your care-partner.

## 2018-08-19 NOTE — Progress Notes (Signed)
Pt's states no medical or surgical changes since previsit or office visit. 

## 2018-08-19 NOTE — Progress Notes (Signed)
Report given to PACU, vss 

## 2018-08-19 NOTE — Progress Notes (Signed)
Called to room to assist during endoscopic procedure.  Patient ID and intended procedure confirmed with present staff. Received instructions for my participation in the procedure from the performing physician.  

## 2018-08-20 ENCOUNTER — Telehealth: Payer: Self-pay

## 2018-08-20 ENCOUNTER — Telehealth: Payer: Self-pay | Admitting: *Deleted

## 2018-08-20 NOTE — Telephone Encounter (Signed)
  Follow up Call-  Call back number 08/19/2018  Post procedure Call Back phone  # [phone]  Permission to leave phone message Yes  Some recent data might be hidden     Patient questions:  Do you have a fever, pain , or abdominal swelling? No. Pain Score  0 *  Have you tolerated food without any problems? Yes.    Have you been able to return to your normal activities? Yes.    Do you have any questions about your discharge instructions: Diet   No. Medications  No. Follow up visit  No.  Do you have questions or concerns about your Care? No.  Actions: * If pain score is 4 or above: No action needed, pain <4.

## 2018-08-20 NOTE — Telephone Encounter (Signed)
Attempted to reach pt. With follow-up call following endoscopic procedure 08/19/2018.  LM on pt. Voice mail.  Will try to reach pt. Again later today.

## 2018-08-27 ENCOUNTER — Encounter: Payer: Self-pay | Admitting: Gastroenterology

## 2018-12-21 ENCOUNTER — Ambulatory Visit: Payer: Commercial Managed Care - PPO | Admitting: Obstetrics and Gynecology

## 2019-03-31 ENCOUNTER — Ambulatory Visit (INDEPENDENT_AMBULATORY_CARE_PROVIDER_SITE_OTHER): Payer: Commercial Managed Care - PPO | Admitting: Cardiology

## 2019-03-31 ENCOUNTER — Encounter: Payer: Self-pay | Admitting: Cardiology

## 2019-03-31 ENCOUNTER — Other Ambulatory Visit: Payer: Self-pay

## 2019-03-31 VITALS — BP 122/74 | HR 70 | Ht 65.0 in | Wt 136.0 lb

## 2019-03-31 DIAGNOSIS — I10 Essential (primary) hypertension: Secondary | ICD-10-CM | POA: Diagnosis not present

## 2019-03-31 DIAGNOSIS — R55 Syncope and collapse: Secondary | ICD-10-CM

## 2019-03-31 NOTE — Progress Notes (Signed)
Cardiology Office Note:    Date:  03/31/2019   ID:  Katherine Ritter, DOB [birth year], MRN [id number]  PCP:  Katherine Nakai, MD  Cardiologist:  Katherine Campus, MD    Referring MD: Katherine Nakai, MD   Chief Complaint  Patient presents with  . Follow-up  Doing well  History of Present Illness:    Katherine Ritter is a 53 y.o. female I met him initially because of episode of syncope.  Quite extensive cardiac work-up has been done which included stress test echocardiogram monitor all of this was unrevealing.  Last time we spoke she supposed to see neurologist and plan was to do long-term EEG monitoring however it did not happen at patient suspected problems with insurance.  Luckily overall she is doing well denies having any recent episode of syncope.  She described to have some sharp very short split-second lasting pain under left breast but otherwise she is doing well.  She try to exercise on the regular basis she works from home she is very aware of coronavirus situation and somewhat anxious about it.  Past Medical History:  Diagnosis Date  . Acute allergic rhinitis   . Anemia   . Asthma   . BMI 24.0-24.9, adult   . Cancer ([place])    skin cancer  . Dizziness    "I've had inner ear before"  . Family history of breast cancer   . Family history of lung cancer   . Family history of throat cancer   . Personal history of skin cancer   . Syncope     Past Surgical History:  Procedure Laterality Date  . BREAST BIOPSY Right 2016  . COLONOSCOPY  last 06/18/2013  . POLYPECTOMY    . skin cancer removal     x4    Current Medications: Current Meds  Medication Sig  . Cholecalciferol (VITAMIN D3 PO) Take by mouth.  Marland Kitchen MAGNESIUM PO Take by mouth.  . polyethylene glycol (MIRALAX / GLYCOLAX) 17 g packet Take 17 g by mouth daily.  . valACYclovir (VALTREX) 500 MG tablet Take 500 mg by mouth as needed.     Allergies:   Patient has no known allergies.   Social History    Socioeconomic History  . Marital status: Significant Other    Spouse name: Not on file  . Number of children: 2  . Years of education: Not on file  . Highest education level: Associate degree: occupational, Hotel manager, or vocational program  Occupational History  . Not on file  Social Needs  . Financial resource strain: Not on file  . Food insecurity    Worry: Not on file    Inability: Not on file  . Transportation needs    Medical: Not on file    Non-medical: Not on file  Tobacco Use  . Smoking status: Never Smoker  . Smokeless tobacco: Never Used  Substance and Sexual Activity  . Alcohol use: Yes    Alcohol/week: 2.0 standard drinks    Types: 2 Standard drinks or equivalent per week  . Drug use: Never  . Sexual activity: Yes    Partners: Male    Birth control/protection: Condom  Lifestyle  . Physical activity    Days per week: Not on file    Minutes per session: Not on file  . Stress: Not on file  Relationships  . Social Herbalist on phone: Not on file    Gets together: Not on file  Attends religious service: Not on file    Active member of club or organization: Not on file    Attends meetings of clubs or organizations: Not on file    Relationship status: Not on file  Other Topics Concern  . Not on file  Social History Narrative   Lives with children & fiance   Right handed   Caffeine: 1-3 cups a week     Family History: The patient's family history includes Breast cancer (age of onset: 8) in her paternal aunt and paternal grandmother; Colon polyps in her mother; Dementia in her father and maternal grandmother; Emphysema in her maternal grandfather; Heart attack in her mother and paternal grandfather; Heart disease in her father; Lung cancer in her mother; Stroke in her father; Throat cancer in her father; Thyroid disease in her paternal aunt. There is no history of Seizures, Colon cancer, Esophageal cancer, Rectal cancer, or Stomach cancer. ROS:    Please see the history of present illness.    All 14 point review of systems negative except as described per history of present illness  EKGs/Labs/Other Studies Reviewed:      Recent Labs: No results found for requested labs within last 8760 hours.  Recent Lipid Panel    Component Value Date/Time   CHOL 188 12/16/2016 1508   TRIG 59 12/16/2016 1508   HDL 68 12/16/2016 1508   CHOLHDL 2.8 12/16/2016 1508   LDLCALC 108 (H) 12/16/2016 1508    Physical Exam:    VS:  BP 122/74   Pulse 70   Ht _0  (1.651 m)   Wt 136 lb (61.7 kg)   LMP 12/01/2011   SpO2 98%   BMI 22.63 kg/m     Wt Readings from Last 3 Encounters:  03/31/19 136 lb (61.7 kg)  08/19/18 139 lb (63 kg)  08/05/18 147 lb (66.7 kg)     GEN:  Well nourished, well developed in no acute distress HEENT: Normal NECK: No JVD; No carotid bruits LYMPHATICS: No lymphadenopathy CARDIAC: RRR, no murmurs, no rubs, no gallops RESPIRATORY:  Clear to auscultation without rales, wheezing or rhonchi  ABDOMEN: Soft, non-tender, non-distended MUSCULOSKELETAL:  No edema; No deformity  SKIN: Warm and dry LOWER EXTREMITIES: no swelling NEUROLOGIC:  Alert and oriented x 3 PSYCHIATRIC:  Normal affect   ASSESSMENT:    1. Essential hypertension   2. Syncope, unspecified syncope type    PLAN:    In order of problems listed above:  1. Essential hypertension blood pressure well controlled without any medications 2. Syncope denies having any.  I advised to follow-up with neurology to see if they still want to do long-term EEG monitoring. 3. See her back in 6 months sooner if she has a problem   Medication Adjustments/Labs and Tests Ordered: Current medicines are reviewed at length with the patient today.  Concerns regarding medicines are outlined above.  No orders of the defined types were placed in this encounter.  Medication changes: No orders of the defined types were placed in this encounter.   Signed, Park Liter, MD, [facility] ([place]) 03/31/2019 4:01 PM    [place]

## 2019-03-31 NOTE — Patient Instructions (Signed)

## 2019-10-07 ENCOUNTER — Other Ambulatory Visit: Payer: Self-pay | Admitting: Obstetrics and Gynecology

## 2019-10-07 DIAGNOSIS — Z1231 Encounter for screening mammogram for malignant neoplasm of breast: Secondary | ICD-10-CM

## 2019-10-15 NOTE — Progress Notes (Signed)
54 y.o. [id number] Significant Other White or Caucasian Not Hispanic or Latino female here for annual exam.  No vaginal bleeding. Lives with her fiance, together since 2003 (live together). No dyspareunia, using a lubricant. She gets hot at night, no sweats.     She has a strong FH of breast cancer.  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 also an association with ovarian cancer, data is limitied.  Above general population risk  TC breast cancer risk is 16.9% (prior to geneti testing), likely underestimate with genetic testing results.    Patient's last menstrual period was 12/01/2011.          Sexually active: Yes.    The current method of family planning is post menopausal status.    Exercising: Yes.    walking  Smoker:  no  Health Maintenance: Pap:  12/16/2016 neg pap/neg HPV, 2016 WNL per patient History of abnormal Pap:  Yes dysplasia years ago, no surgery on her cervix   MMG:  07/27/18  Density C Bi-rads 1 neg, scheduled next week.  BMD: no Colonoscopy: 08/19/18, f/u in 5 years.  TDaP:  12/17/17 Gardasil: NA   reports that she has never smoked. She has never used smokeless tobacco. She reports current alcohol use of about 2.0 standard drinks of alcohol per week. She reports that she does not use drugs. She works in Orthoptist. 2 grown sons.   Past Medical History:  Diagnosis Date  . Acute allergic rhinitis   . Anemia   . Asthma   . BMI 24.0-24.9, adult   . Cancer ([place])    skin cancer  . Dizziness    "I've had inner ear before"  . Family history of breast cancer   . Family history of lung cancer   . Family history of throat cancer   . Personal history of skin cancer   . Syncope     Past Surgical History:  Procedure Laterality Date  . BREAST BIOPSY Right 2016  . COLONOSCOPY  last 06/18/2013  . POLYPECTOMY    . skin cancer removal     x4    Current Outpatient Medications  Medication Sig Dispense Refill  .  Cholecalciferol (VITAMIN D3 PO) Take by mouth.    Marland Kitchen MAGNESIUM PO Take by mouth.    . polyethylene glycol (MIRALAX / GLYCOLAX) 17 g packet Take 17 g by mouth daily.    . valACYclovir (VALTREX) 500 MG tablet Take 500 mg by mouth as needed.     No current facility-administered medications for this visit.    Family History  Problem Relation Age of Onset  . Lung cancer Mother   . Heart attack Mother   . Colon polyps Mother   . Throat cancer Father   . Dementia Father   . Heart disease Father   . Stroke Father   . Breast cancer Paternal Aunt 30  . Thyroid disease Paternal Aunt   . Breast cancer Paternal Grandmother 50  . Dementia Maternal Grandmother   . Emphysema Maternal Grandfather   . Heart attack Paternal Grandfather   . Seizures Neg Hx   . Colon cancer Neg Hx   . Esophageal cancer Neg Hx   . Rectal cancer Neg Hx   . Stomach cancer Neg Hx     Review of Systems  Constitutional: Negative.   HENT: Negative.   Eyes: Negative.   Respiratory: Negative.   Cardiovascular: Negative.   Gastrointestinal: Negative.  Endocrine: Negative.   Genitourinary: Negative.   Musculoskeletal: Negative.   Skin: Negative.   Allergic/Immunologic: Negative.   Neurological: Negative.   Hematological: Negative.   Psychiatric/Behavioral: Negative.     Exam:   BP 122/64   Pulse 79   Temp 98.4 F (36.9 C)   Ht 5\' 4"  (1.626 m)   Wt 137 lb (62.1 kg)   LMP 12/01/2011   SpO2 99%   BMI 23.52 kg/m   Weight change: @WEIGHTCHANGE @ Height:   Height: 5\' 4"  (162.6 cm)  Ht Readings from Last 3 Encounters:  10/18/19 5\' 4"  (1.626 m)  03/31/19 5\' 5"  (1.651 m)  08/19/18 5\' 5"  (1.651 m)    General appearance: alert, cooperative and appears stated age Head: Normocephalic, without obvious abnormality, atraumatic Neck: no adenopathy, supple, symmetrical, trachea midline and thyroid normal to inspection and palpation Lungs: clear to auscultation bilaterally Cardiovascular: regular rate and  rhythm Breasts: normal appearance, no masses or tenderness Abdomen: soft, non-tender; non distended,  no masses,  no organomegaly Extremities: extremities normal, atraumatic, no cyanosis or edema Skin: Skin color, texture, turgor normal. No rashes or lesions Lymph nodes: Cervical, supraclavicular, and axillary nodes normal. No abnormal inguinal nodes palpated Neurologic: Grossly normal   Pelvic: External genitalia:  no lesions              Urethra:  normal appearing urethra with no masses, tenderness or lesions              Bartholins and Skenes: normal                 Vagina: normal appearing vagina with normal color and discharge, no lesions              Cervix: no lesions               Bimanual Exam:  Uterus:  normal size, contour, position, consistency, mobility, non-tender              Adnexa: no mass, fullness, tenderness               Rectovaginal: Confirms               Anus:  normal sphincter tone, no lesions  Gae Dry chaperoned for the exam.  A:  Well Woman with normal exam  Strong FH of breast cancer.   Risk of breast cancer is at least 16.9%  P:   Mammogram next week  Will do an abbreviated breast MRI in 11/21  Discussed breast self exam  Discussed calcium and vit D intake  Vit d def in diet, will check  Screening labs.

## 2019-10-18 ENCOUNTER — Encounter: Payer: Self-pay | Admitting: Obstetrics and Gynecology

## 2019-10-18 ENCOUNTER — Ambulatory Visit: Payer: Commercial Managed Care - PPO | Admitting: Obstetrics and Gynecology

## 2019-10-18 ENCOUNTER — Other Ambulatory Visit: Payer: Self-pay

## 2019-10-18 VITALS — BP 122/64 | HR 79 | Temp 98.4°F | Ht 64.0 in | Wt 137.0 lb

## 2019-10-18 DIAGNOSIS — Z803 Family history of malignant neoplasm of breast: Secondary | ICD-10-CM | POA: Diagnosis not present

## 2019-10-18 DIAGNOSIS — E559 Vitamin D deficiency, unspecified: Secondary | ICD-10-CM

## 2019-10-18 DIAGNOSIS — Z9189 Other specified personal risk factors, not elsewhere classified: Secondary | ICD-10-CM

## 2019-10-18 DIAGNOSIS — Z01419 Encounter for gynecological examination (general) (routine) without abnormal findings: Secondary | ICD-10-CM | POA: Diagnosis not present

## 2019-10-18 DIAGNOSIS — Z0000 Encounter for general adult medical examination without abnormal findings: Secondary | ICD-10-CM | POA: Diagnosis not present

## 2019-10-18 NOTE — Patient Instructions (Signed)

## 2019-10-19 ENCOUNTER — Telehealth: Payer: Self-pay | Admitting: *Deleted

## 2019-10-19 DIAGNOSIS — Z803 Family history of malignant neoplasm of breast: Secondary | ICD-10-CM

## 2019-10-19 DIAGNOSIS — Z9189 Other specified personal risk factors, not elsewhere classified: Secondary | ICD-10-CM

## 2019-10-19 LAB — COMPREHENSIVE METABOLIC PANEL
ALT: 15 IU/L (ref 0–32)
AST: 16 IU/L (ref 0–40)
Albumin/Globulin Ratio: 2 (ref 1.2–2.2)
Albumin: 4.5 g/dL (ref 3.8–4.9)
Alkaline Phosphatase: 73 IU/L (ref 39–117)
BUN/Creatinine Ratio: 26 — ABNORMAL HIGH (ref 9–23)
BUN: 18 mg/dL (ref 6–24)
Bilirubin Total: 1.7 mg/dL — ABNORMAL HIGH (ref 0.0–1.2)
CO2: 22 mmol/L (ref 20–29)
Calcium: 9 mg/dL (ref 8.7–10.2)
Chloride: 102 mmol/L (ref 96–106)
Creatinine, Ser: 0.68 mg/dL (ref 0.57–1.00)
GFR calc Af Amer: 114 mL/min/{1.73_m2} (ref >59)
GFR calc non Af Amer: 99 mL/min/{1.73_m2} (ref >59)
Globulin, Total: 2.3 g/dL (ref 1.5–4.5)
Glucose: 90 mg/dL (ref 65–99)
Potassium: 4.3 mmol/L (ref 3.5–5.2)
Sodium: 141 mmol/L (ref 134–144)
Total Protein: 6.8 g/dL (ref 6.0–8.5)

## 2019-10-19 LAB — CBC
Hematocrit: 40.3 % (ref 34.0–46.6)
Hemoglobin: 13.6 g/dL (ref 11.1–15.9)
MCH: 29.6 pg (ref 26.6–33.0)
MCHC: 33.7 g/dL (ref 31.5–35.7)
MCV: 88 fL (ref 79–97)
Platelets: 222 10*3/uL (ref 150–450)
RBC: 4.6 x10E6/uL (ref 3.77–5.28)
RDW: 13.1 % (ref 11.7–15.4)
WBC: 6.2 10*3/uL (ref 3.4–10.8)

## 2019-10-19 LAB — VITAMIN D 25 HYDROXY (VIT D DEFICIENCY, FRACTURES): Vit D, 25-Hydroxy: 26.4 ng/mL — ABNORMAL LOW (ref 30.0–100.0)

## 2019-10-19 LAB — LIPID PANEL
Chol/HDL Ratio: 2.8 ratio (ref 0.0–4.4)
Cholesterol, Total: 255 mg/dL — ABNORMAL HIGH (ref 100–199)
HDL: 91 mg/dL (ref >39)
LDL Chol Calc (NIH): 140 mg/dL — ABNORMAL HIGH (ref 0–99)
Triglycerides: 139 mg/dL (ref 0–149)
VLDL Cholesterol Cal: 24 mg/dL (ref 5–40)

## 2019-10-19 NOTE — Telephone Encounter (Signed)
Order placed for abbreviated Breast MRI at [facility], to be scheduled 04/2020.   Call to patient to notify. Patient is aware she will be contacted by [facility] dorectly to schedule. Questions answered.   Routing to provider for final review. Patient is agreeable to disposition. Will close encounter.  Cc: Hayley Carder

## 2019-10-19 NOTE — Telephone Encounter (Signed)
-----   Message from Salvadore Dom, MD sent at 10/18/2019  2:29 PM EDT ----- She needs an abbreviated breast MRI in 11/20. Risk of breast cancer is 16.9%. Thanks, Sharee Pimple

## 2019-10-26 ENCOUNTER — Ambulatory Visit
Admission: RE | Admit: 2019-10-26 | Discharge: 2019-10-26 | Disposition: A | Discharge status: Home / Self Care | Payer: Commercial Managed Care - PPO | Source: Ambulatory Visit | Attending: Obstetrics and Gynecology | Admitting: Obstetrics and Gynecology

## 2019-10-26 ENCOUNTER — Other Ambulatory Visit: Payer: Self-pay

## 2019-10-26 DIAGNOSIS — Z1231 Encounter for screening mammogram for malignant neoplasm of breast: Secondary | ICD-10-CM

## 2019-10-26 IMAGING — MG DIGITAL SCREENING BILAT W/ TOMO W/ CAD
8 series · 9 of 24 positions shown · non-contrast
Comparison: Previous exam(s).

CLINICAL DATA: Screening.

EXAM:
DIGITAL SCREENING BILATERAL MAMMOGRAM WITH TOMO AND CAD

[L CC synth-2D]
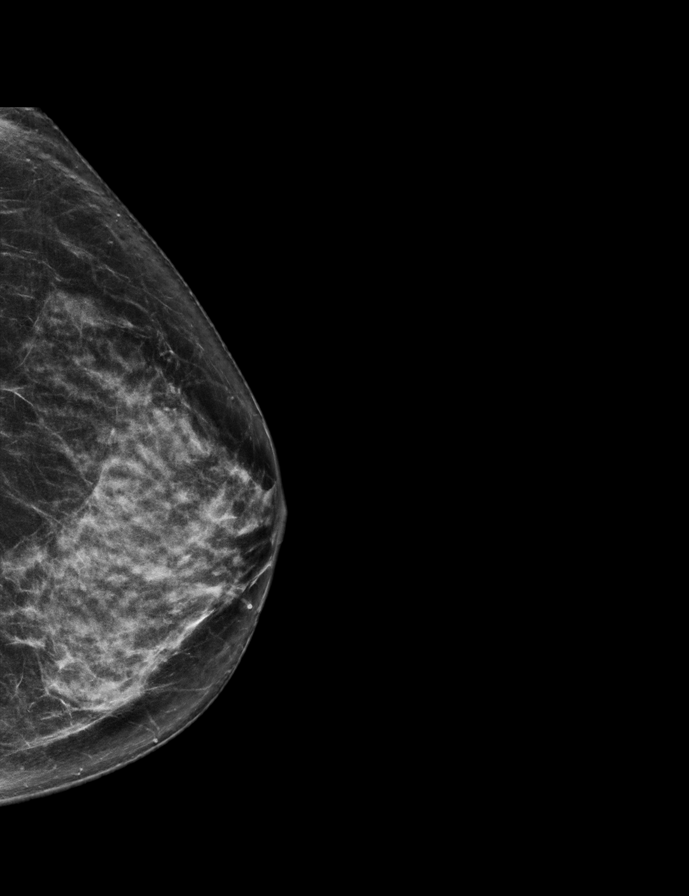

[R CC synth-2D]
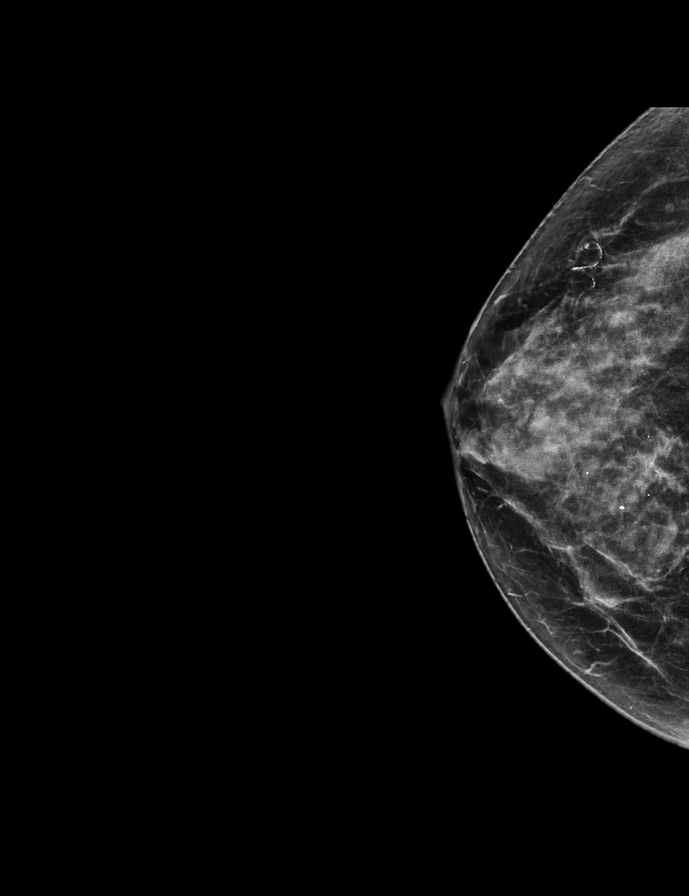

[L MLO synth-2D]
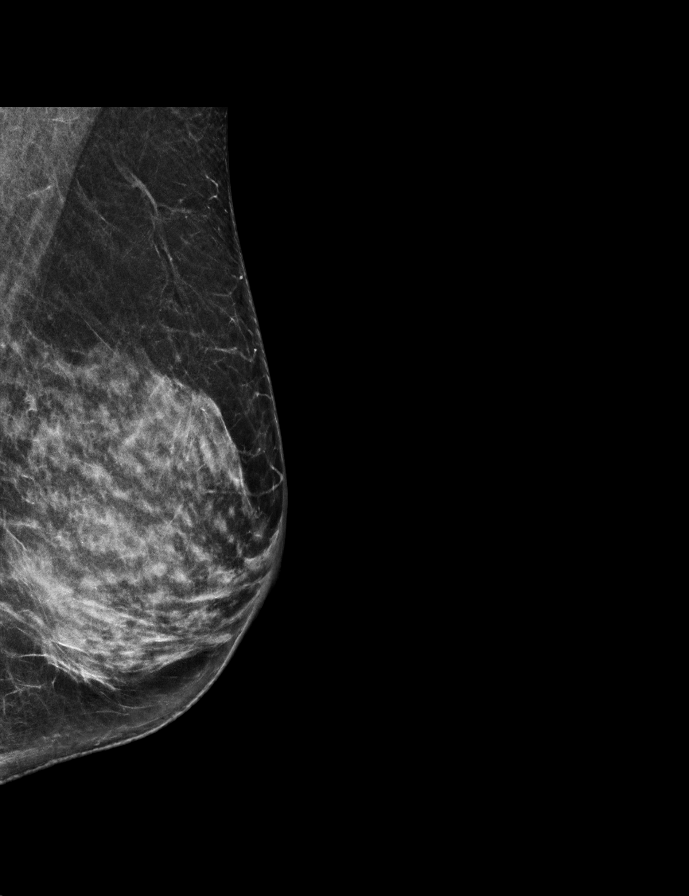

[R MLO synth-2D]
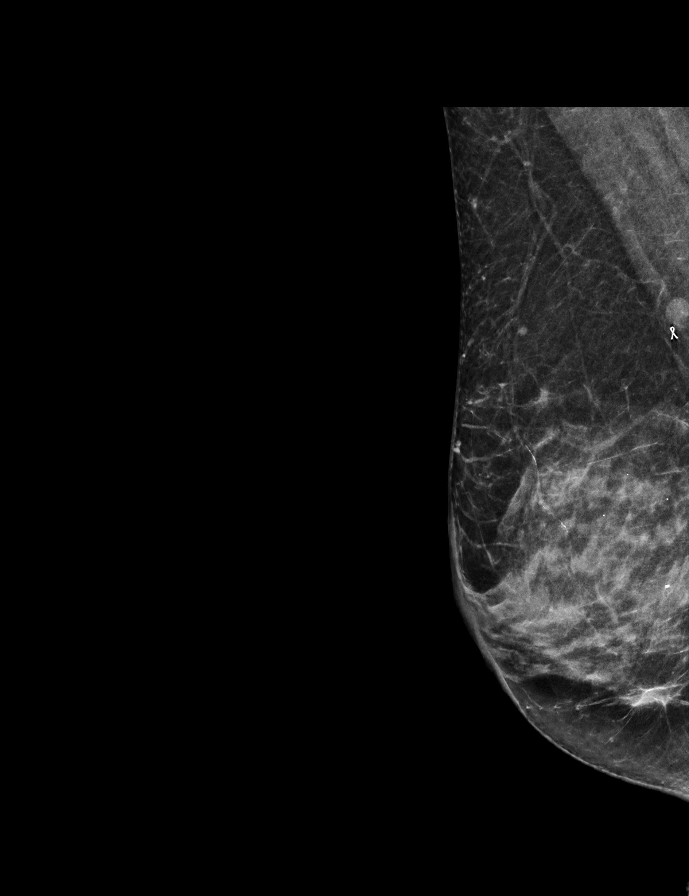

[L MLO tomo · 2 of 59 frames shown]
[frame 20/59]
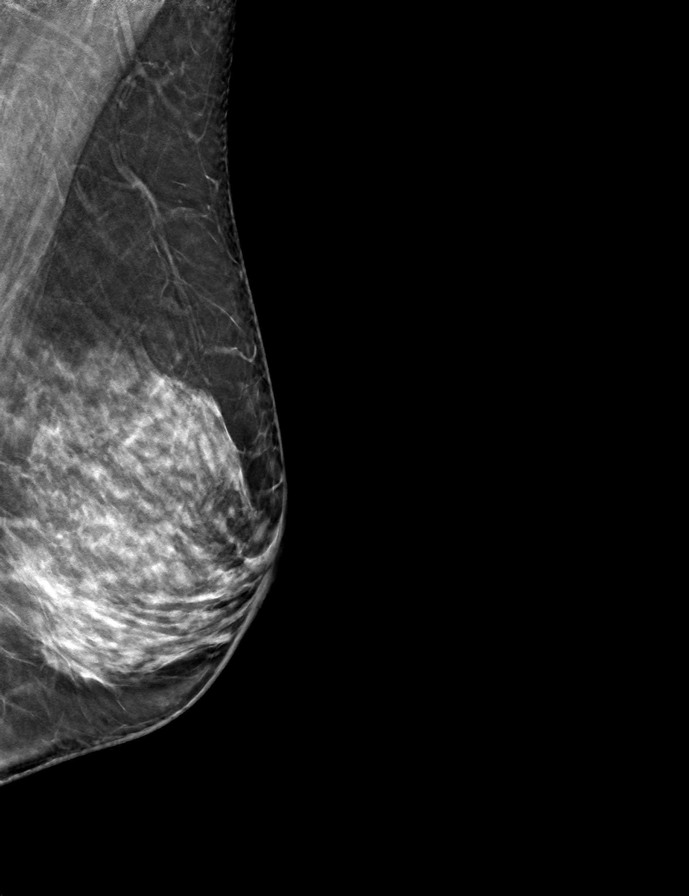
[frame 30/59]
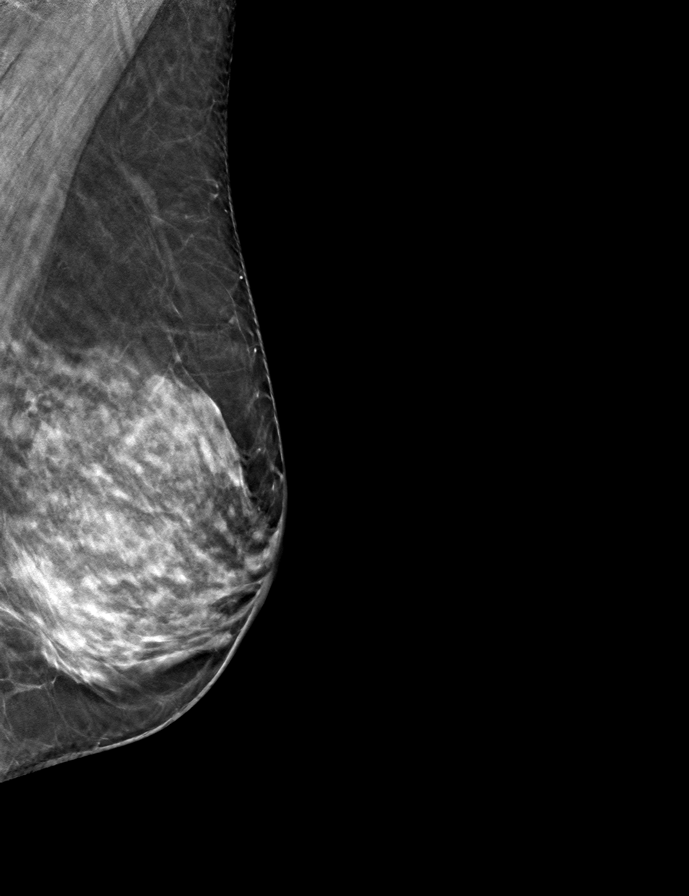

[R CC tomo · tomo slice 27/54.0]
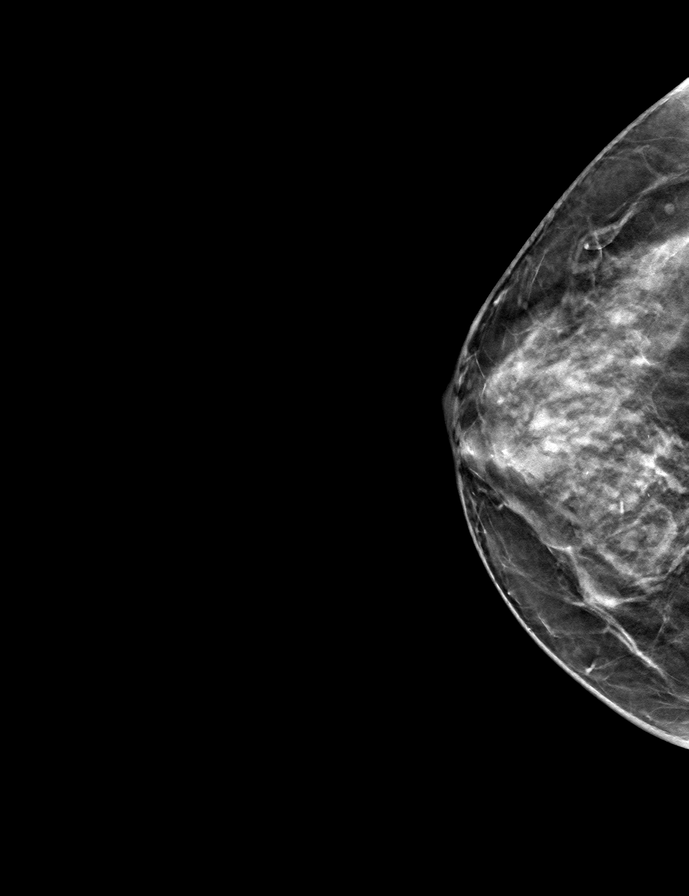

[R MLO tomo · tomo slice 29/57.0]
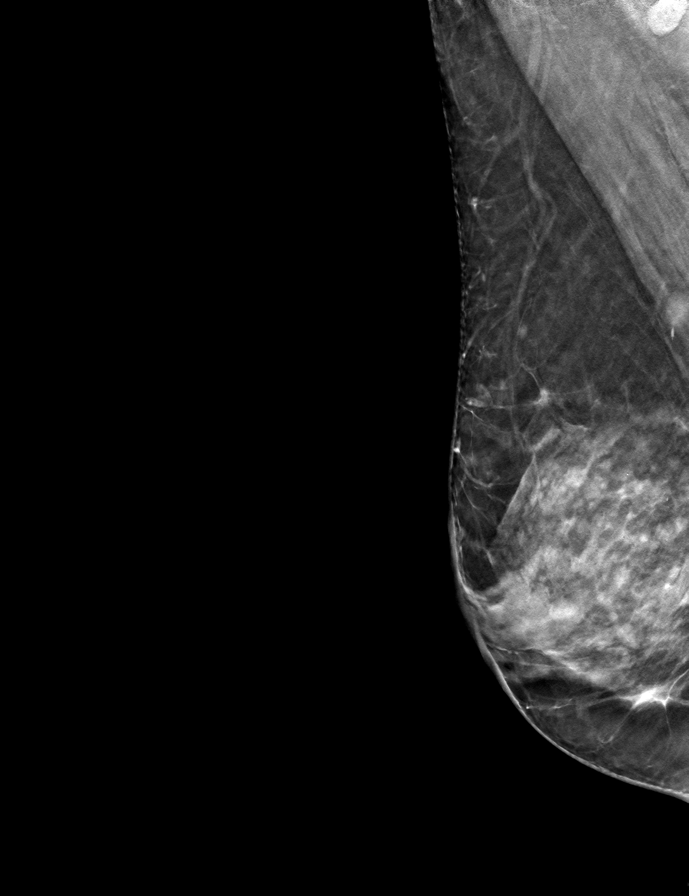

[L CC tomo · tomo slice 31/61.0]
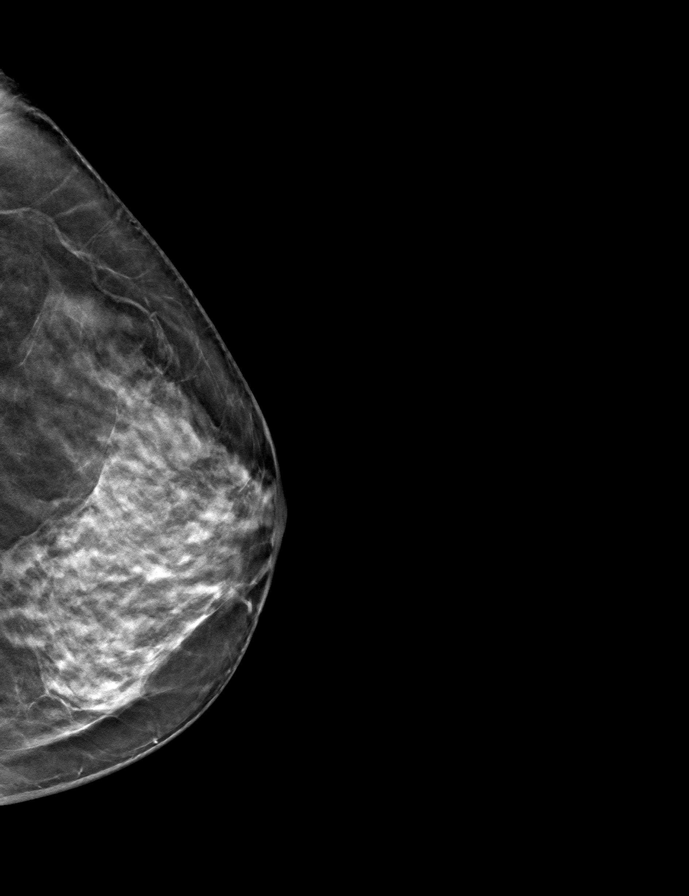

[9 of 24 positions shown; findings below may reference images not displayed]

ACR Breast Density Category c: The breast tissue is heterogeneously
dense, which may obscure small masses.
FINDINGS: There are no findings suspicious for malignancy. Images were
processed with CAD.
IMPRESSION: No mammographic evidence of malignancy. A result letter of this
screening mammogram will be mailed directly to the patient.

RECOMMENDATION:
Screening mammogram in one year. (Code:[5V])

BI-RADS CATEGORY  1: Negative.

## 2020-03-13 ENCOUNTER — Encounter: Payer: Self-pay | Admitting: Licensed Clinical Social Worker

## 2020-03-13 ENCOUNTER — Telehealth: Payer: Self-pay | Admitting: Licensed Clinical Social Worker

## 2020-03-13 DIAGNOSIS — Z1379 Encounter for other screening for genetic and chromosomal anomalies: Secondary | ICD-10-CM | POA: Insufficient documentation

## 2020-03-13 NOTE — Telephone Encounter (Signed)
Discussed the amended report for Katherine Ritter test results. 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

## 2020-04-18 ENCOUNTER — Ambulatory Visit
Admission: RE | Admit: 2020-04-18 | Discharge: 2020-04-18 | Disposition: A | Discharge status: Home / Self Care | Payer: Self-pay | Source: Ambulatory Visit | Attending: Obstetrics and Gynecology | Admitting: Obstetrics and Gynecology

## 2020-04-18 ENCOUNTER — Other Ambulatory Visit: Payer: Self-pay

## 2020-04-18 DIAGNOSIS — Z803 Family history of malignant neoplasm of breast: Secondary | ICD-10-CM

## 2020-04-18 DIAGNOSIS — Z9189 Other specified personal risk factors, not elsewhere classified: Secondary | ICD-10-CM

## 2020-04-18 IMAGING — MR MR BREAST WO/W CM  BILAT
4 of 5 series · 28 of 48 positions shown · IV contrast (6 GADAVIST)
Comparison: [DATE]

CLINICAL DATA: Abbreviated Breast MRI for breast cancer screening.

Intermediate risk breast cancer. Previous benign biopsy of the RIGHT
breast. 15-20% lifetime risk.
LABS:  None obtained at the time of imaging.
EXAM:
BILATERAL ABBREVIATED BREAST MRI WITH AND WITHOUT CONTRAST
TECHNIQUE: Multiplanar, multisequence MR images of both breasts were obtained
prior to and following the intravenous administration of ml of
Gadavist

[Series 2: t2_tirm_tra ipat (a-p) · axial · 3.0mm · 0.62mm/px · z∈[-118,+74]mm · 5 of 65 slices shown]
[im 1/65]
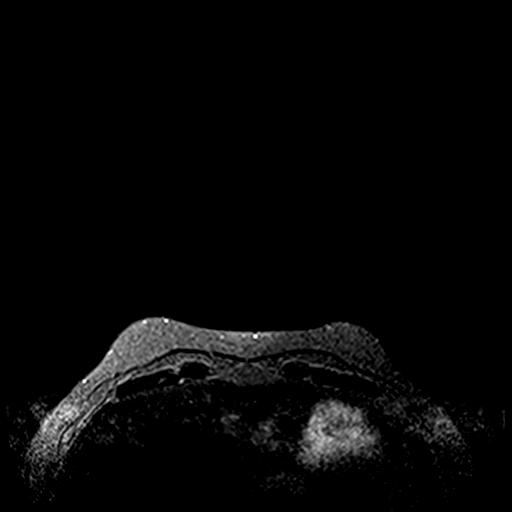
[im 17/65]
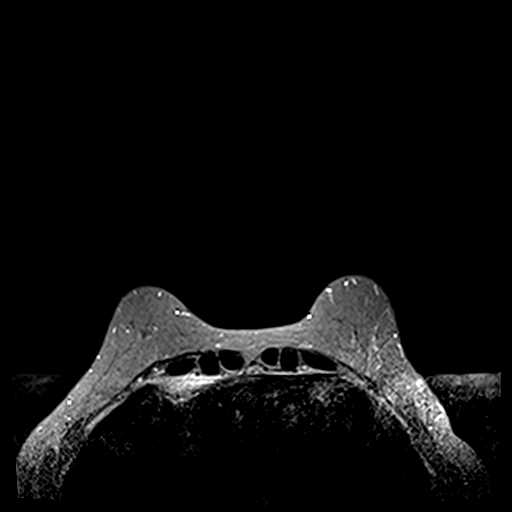
[im 33/65]
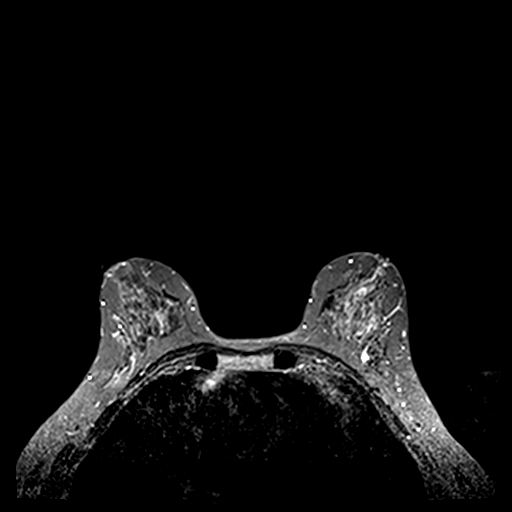
[im 49/65]
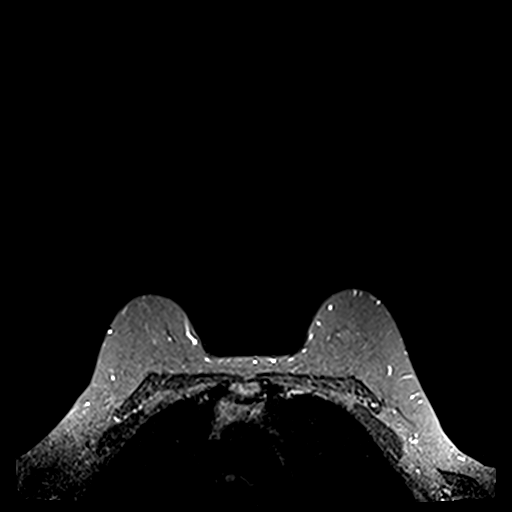
[im 65/65]
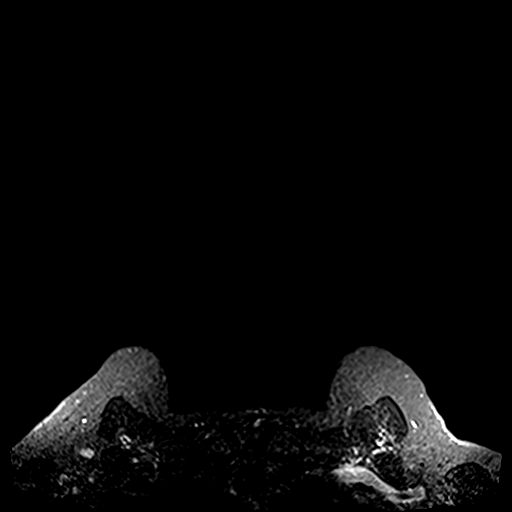

[Series 3: fl3d pre-cm · axial · non-contrast · 0.9mm · 0.77mm/px · z∈[-108,+64]mm · 8 of 192 slices shown]
[im 1/192]
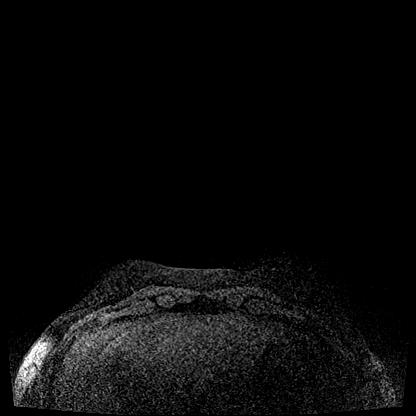
[im 30/192]
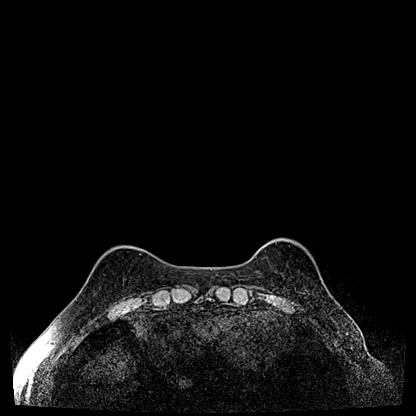
[im 59/192]
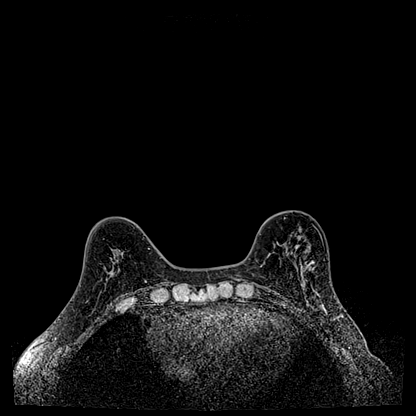
[im 89/192]
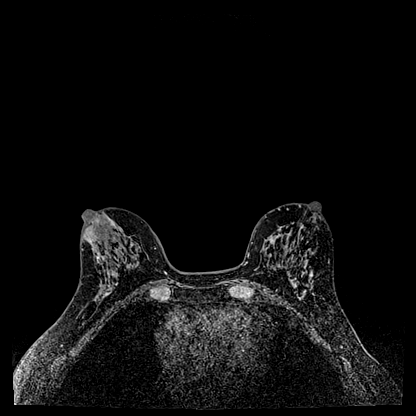
[im 103/192]
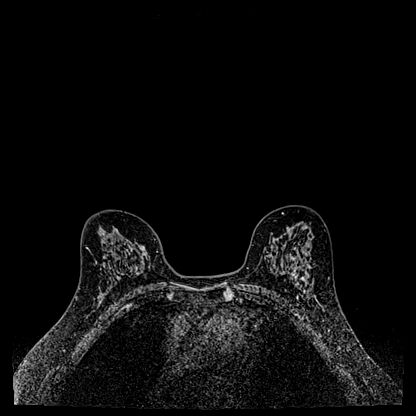
[im 133/192]
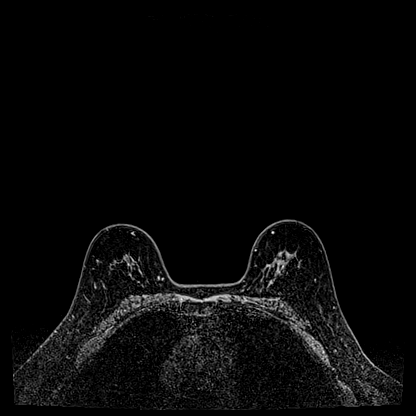
[im 162/192]
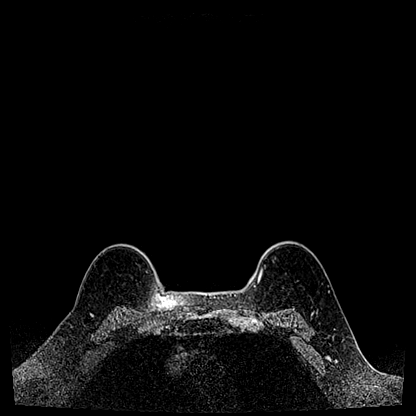
[im 192/192]
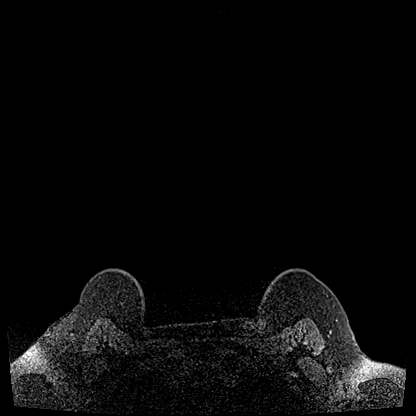

[Series 4: fl3d post-cm 20 · axial · 0.9mm · 0.77mm/px · z∈[-108,+64]mm · 8 of 192 slices shown (1 of 2)]
[im 1/192]
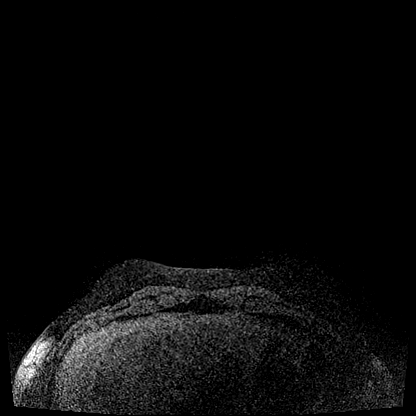
[im 30/192]
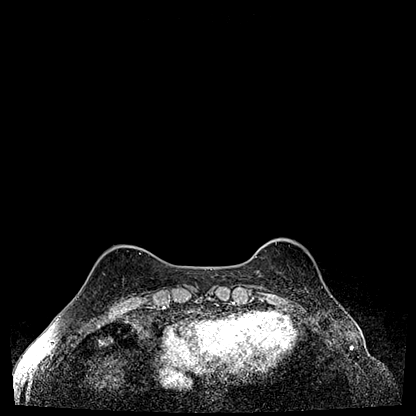
[im 59/192]
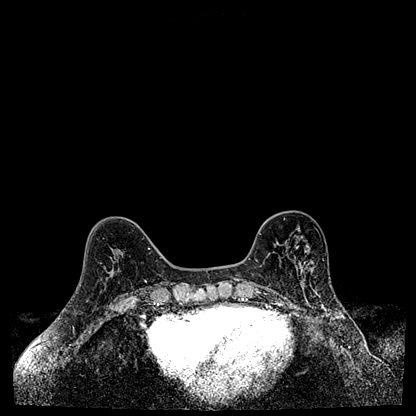
[im 89/192]
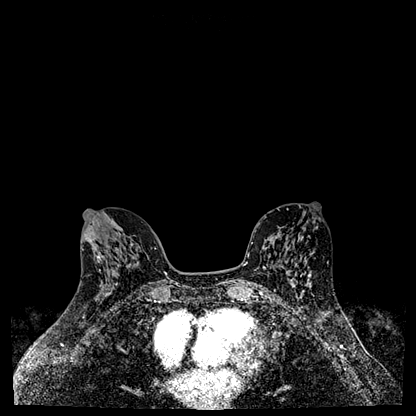
[im 103/192]
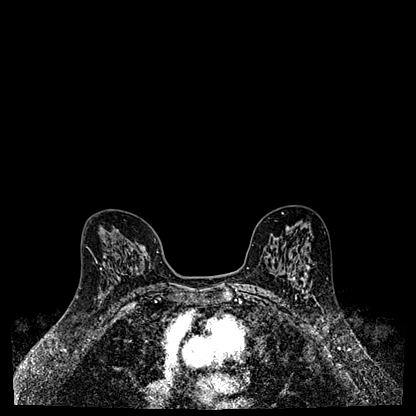
[im 133/192]
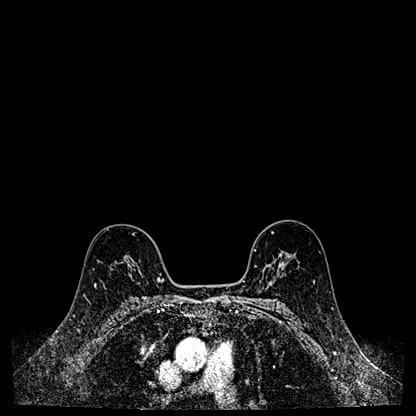
[im 162/192]
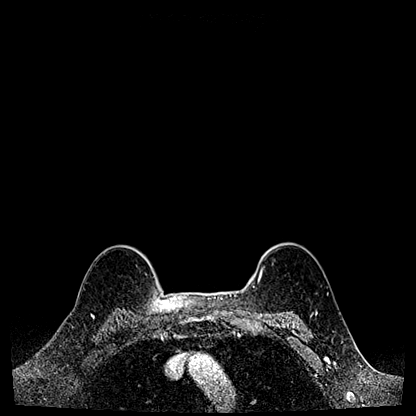
[im 192/192]
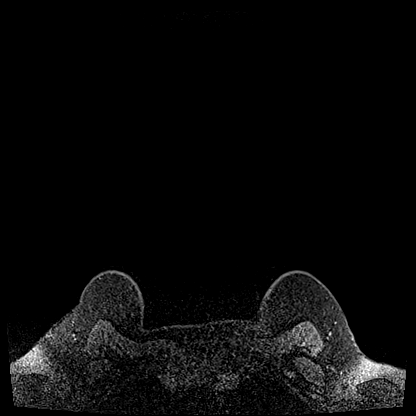

[Series 5: fl3d post-cm 20 · axial · 0.9mm · 0.77mm/px · z∈[-108,+37]mm · 7 of 192 slices shown (2 of 2)]
[im 1/192]
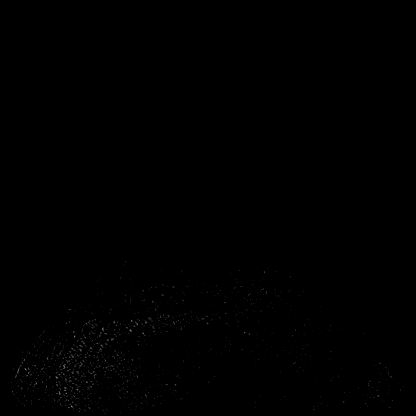
[im 30/192]
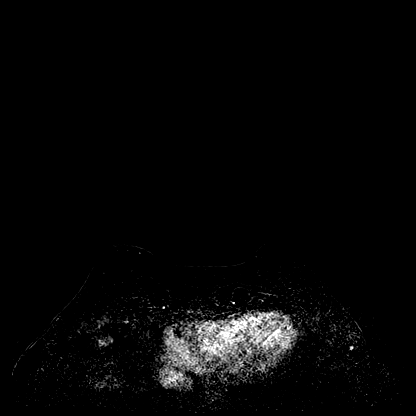
[im 59/192]
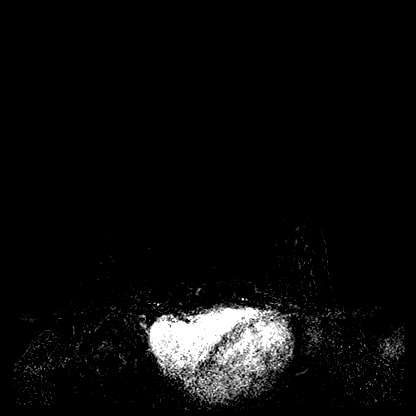
[im 89/192]
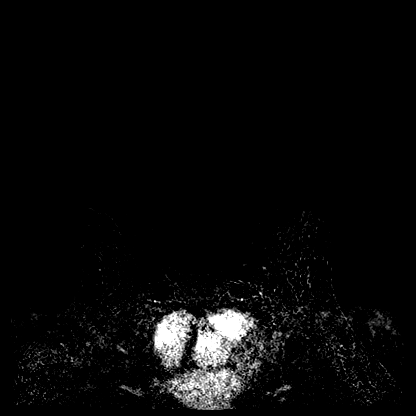
[im 103/192]
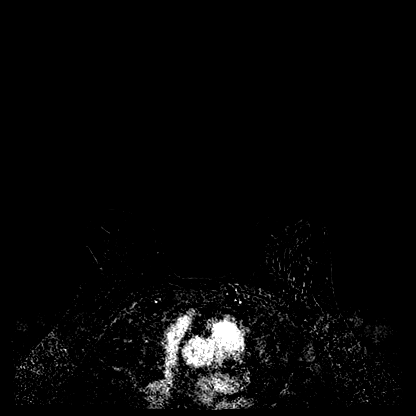
[im 133/192]
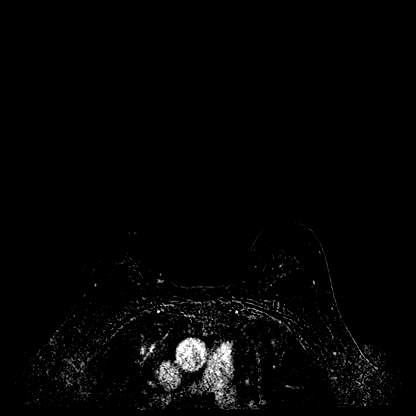
[im 162/192]
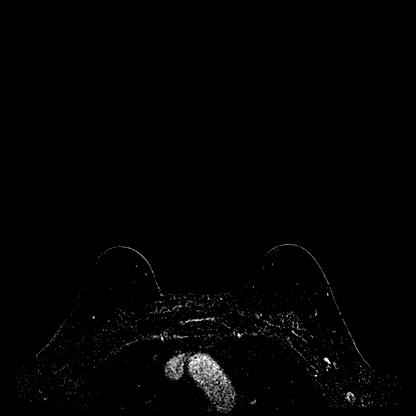

[28 of 48 positions shown; findings below may reference images not displayed]

FINDINGS: Breast composition: c. Heterogeneous fibroglandular tissue.

Background parenchymal enhancement: Minimal

Right breast: No mass or abnormal enhancement.

Left breast: No mass or abnormal enhancement.

Lymph nodes: No abnormal appearing lymph nodes.

Ancillary findings:  None.
IMPRESSION: No MRI evidence for malignancy.

RECOMMENDATION:
Recommend screening mammogram in [DATE].

The patient is eligible for annual Abbreviated Breast MRI.

BI-RADS CATEGORY  1: Negative.

## 2020-04-18 MED ORDER — GADOBUTROL 1 MMOL/ML IV SOLN
6.0000 mL | Freq: Once | INTRAVENOUS | Status: AC | PRN
  Administered 2020-04-18: 6 mL via INTRAVENOUS

## 2020-04-19 ENCOUNTER — Other Ambulatory Visit: Payer: Self-pay | Admitting: *Deleted

## 2020-04-19 DIAGNOSIS — Z9189 Other specified personal risk factors, not elsewhere classified: Secondary | ICD-10-CM

## 2020-04-19 DIAGNOSIS — Z803 Family history of malignant neoplasm of breast: Secondary | ICD-10-CM

## 2020-05-25 NOTE — Progress Notes (Signed)
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

## 2020-10-16 NOTE — Progress Notes (Signed)
55 y.o. Katherine Ritter Significant Other White or Caucasian Not Hispanic or Latino female here for annual exam.   No vaginal bleeding. No dyspareunia. No bowel or bladder c/o.     She has a strong FH of breast cancer.  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 also an association with ovarian cancer, data is limitied.  Above general population risk  TC breast cancer risk is 16.9% (prior to geneti testing), likely underestimate with genetic testing results.   Patient's last menstrual period was 12/01/2011.          Sexually active: Yes.    The current method of family planning is post menopausal status.    Exercising: No.  The patient does not participate in regular exercise at present. Smoker:  no  Health Maintenance: Pap:  12/16/2016 neg pap/neg HPV,2016 WNL per patient  History of abnormal Pap:   Yes dysplasia years ago, no surgery on her cervix   MMG: MRI  04/18/20 Bi-rads 1 neg  BMD:   None  Colonoscopy:08/19/18, f/u in 5 years.   TDaP:  12/17/17  Gardasil: NA   reports that she has never smoked. She has never used smokeless tobacco. She reports current alcohol use of about 2.0 standard drinks of alcohol per week. She reports that she does not use drugs. She works in Orthoptist. 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   Past Medical History:  Diagnosis Date  . Acute allergic rhinitis   . Anemia   . Asthma   . BMI 24.0-24.9, adult   . Cancer ([place])    skin cancer  . Dizziness    "I've had inner ear before"  . Family history of breast cancer   . Family history of lung cancer   . Family history of throat cancer   . Personal history of skin cancer   . Syncope     Past Surgical History:  Procedure Laterality Date  . BREAST BIOPSY Right 2016  . COLONOSCOPY  last 06/18/2013  . POLYPECTOMY    . skin cancer removal     x4    Current Outpatient Medications  Medication Sig Dispense Refill  . Cholecalciferol (VITAMIN D3 PO)  Take by mouth.    Marland Kitchen MAGNESIUM PO Take by mouth.    . polyethylene glycol (MIRALAX / GLYCOLAX) 17 g packet Take 17 g by mouth daily.    . valACYclovir (VALTREX) 500 MG tablet Take 500 mg by mouth as needed.    . vitamin C (ASCORBIC ACID) 500 MG tablet Take 500 mg by mouth daily.     No current facility-administered medications for this visit.  Valtrex for cold sores.   Family History  Problem Relation Age of Onset  . Lung cancer Mother   . Heart attack Mother   . Colon polyps Mother   . Throat cancer Father   . Dementia Father   . Heart disease Father   . Stroke Father   . Breast cancer Paternal Aunt 2  . Thyroid disease Paternal Aunt   . Breast cancer Paternal Grandmother 10  . Dementia Maternal Grandmother   . Emphysema Maternal Grandfather   . Heart attack Paternal Grandfather   . Seizures Neg Hx   . Colon cancer Neg Hx   . Esophageal cancer Neg Hx   . Rectal cancer Neg Hx   . Stomach cancer Neg Hx     Review of Systems  All other systems reviewed and  are negative.   Exam:   BP 110/60   Pulse 81   Ht 5\' 5"  (1.651 m)   Wt 134 lb (60.8 kg)   LMP 12/01/2011   SpO2 97%   BMI 22.30 kg/m   Weight change: @WEIGHTCHANGE @ Height:   Height: 5\' 5"  (165.1 cm)  Ht Readings from Last 3 Encounters:  10/18/20 5\' 5"  (1.651 m)  10/18/19 5\' 4"  (1.626 m)  03/31/19 5\' 5"  (1.651 m)    General appearance: alert, cooperative and appears stated age Head: Normocephalic, without obvious abnormality, atraumatic Neck: no adenopathy, supple, symmetrical, trachea midline and thyroid normal to inspection and palpation Lungs: clear to auscultation bilaterally Cardiovascular: regular rate and rhythm Breasts: normal appearance, no masses or tenderness Abdomen: soft, non-tender; non distended,  no masses,  no organomegaly Extremities: extremities normal, atraumatic, no cyanosis or edema Skin: Skin color, texture, turgor normal. No rashes or lesions Lymph nodes: Cervical, supraclavicular,  and axillary nodes normal. No abnormal inguinal nodes palpated Neurologic: Grossly normal   Pelvic: External genitalia:  no lesions              Urethra:  normal appearing urethra with no masses, tenderness or lesions              Bartholins and Skenes: normal                 Vagina: normal appearing vagina with normal color and discharge, no lesions              Cervix: no lesions               Bimanual Exam:  Uterus:  normal size, contour, position, consistency, mobility, non-tender              Adnexa: no mass, fullness, tenderness               Rectovaginal: Confirms               Anus:  normal sphincter tone, no lesions  Gae Dry chaperoned for the exam.  1. Well woman exam Discussed calcium and vit D intake Colonoscopy UTD Labs with primary  2. Increased risk of breast cancer Mammogram due Abbreviated MRI due in 11/22 Discussed breast self exam  3. H/O cold sores Occasional outbreaks.  - valACYclovir (VALTREX) 500 MG tablet; Take 4 tablets po q 12 hours x 2 doses as needed.  Dispense: 30 tablet; Refill: 2

## 2020-10-18 ENCOUNTER — Ambulatory Visit (INDEPENDENT_AMBULATORY_CARE_PROVIDER_SITE_OTHER): Payer: Commercial Managed Care - PPO | Admitting: Obstetrics and Gynecology

## 2020-10-18 ENCOUNTER — Encounter: Payer: Self-pay | Admitting: Obstetrics and Gynecology

## 2020-10-18 ENCOUNTER — Other Ambulatory Visit: Payer: Self-pay

## 2020-10-18 VITALS — BP 110/60 | HR 81 | Ht 65.0 in | Wt 134.0 lb

## 2020-10-18 DIAGNOSIS — Z01419 Encounter for gynecological examination (general) (routine) without abnormal findings: Secondary | ICD-10-CM

## 2020-10-18 DIAGNOSIS — Z9189 Other specified personal risk factors, not elsewhere classified: Secondary | ICD-10-CM | POA: Diagnosis not present

## 2020-10-18 DIAGNOSIS — Z8619 Personal history of other infectious and parasitic diseases: Secondary | ICD-10-CM

## 2020-10-18 MED ORDER — VALACYCLOVIR HCL 500 MG PO TABS: ORAL_TABLET | ORAL | 2 refills | Status: DC

## 2020-10-18 NOTE — Patient Instructions (Signed)

## 2020-12-13 ENCOUNTER — Other Ambulatory Visit: Payer: Self-pay | Admitting: Obstetrics and Gynecology

## 2020-12-13 DIAGNOSIS — Z1231 Encounter for screening mammogram for malignant neoplasm of breast: Secondary | ICD-10-CM

## 2020-12-19 ENCOUNTER — Ambulatory Visit
Admission: RE | Admit: 2020-12-19 | Discharge: 2020-12-19 | Disposition: A | Discharge status: Home / Self Care | Payer: No Typology Code available for payment source | Source: Ambulatory Visit | Attending: Obstetrics and Gynecology | Admitting: Obstetrics and Gynecology

## 2020-12-19 ENCOUNTER — Other Ambulatory Visit: Payer: Self-pay

## 2020-12-19 DIAGNOSIS — Z1231 Encounter for screening mammogram for malignant neoplasm of breast: Secondary | ICD-10-CM

## 2020-12-19 IMAGING — MG MM DIGITAL SCREENING BILAT W/ TOMO AND CAD
8 series · 9 of 24 positions shown · non-contrast
Comparison: Previous exam(s).

CLINICAL DATA: Screening.

EXAM:
DIGITAL SCREENING BILATERAL MAMMOGRAM WITH TOMOSYNTHESIS AND CAD
TECHNIQUE: Bilateral screening digital craniocaudal and mediolateral oblique
mammograms were obtained. Bilateral screening digital breast
tomosynthesis was performed. The images were evaluated with
computer-aided detection.

[R MLO synth-2D]
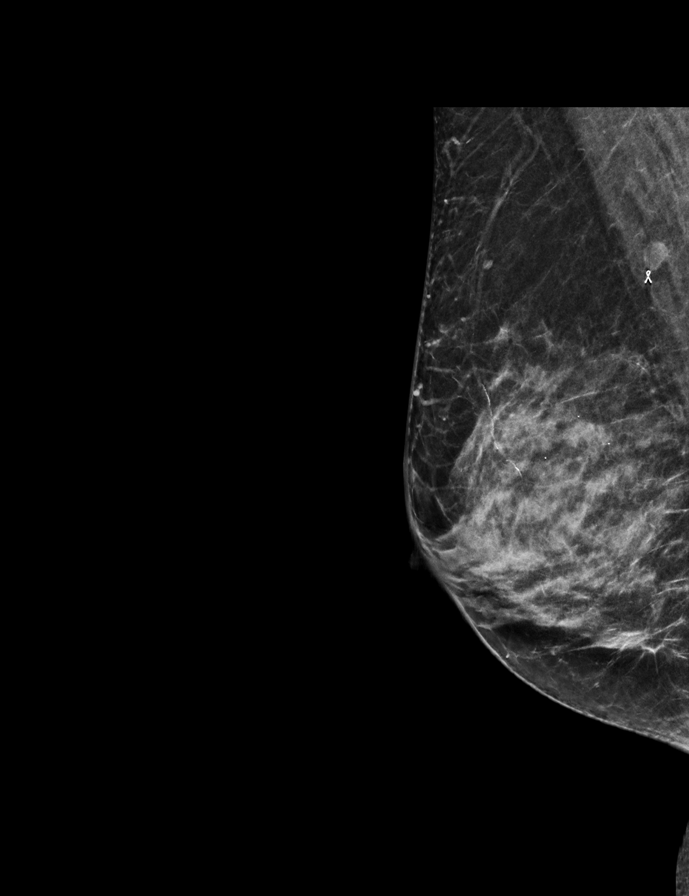

[R CC synth-2D]
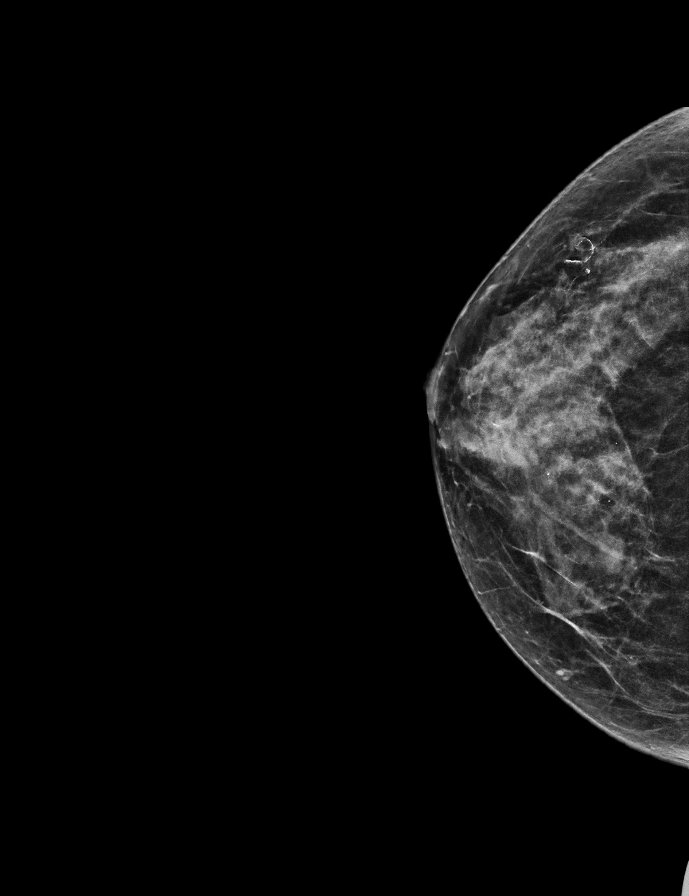

[L MLO synth-2D]
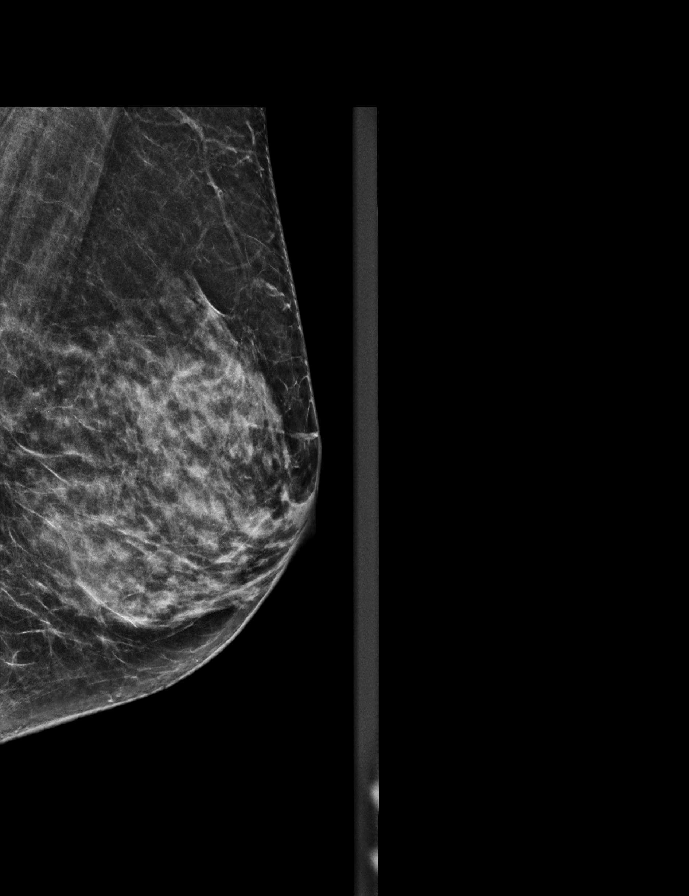

[L CC synth-2D]
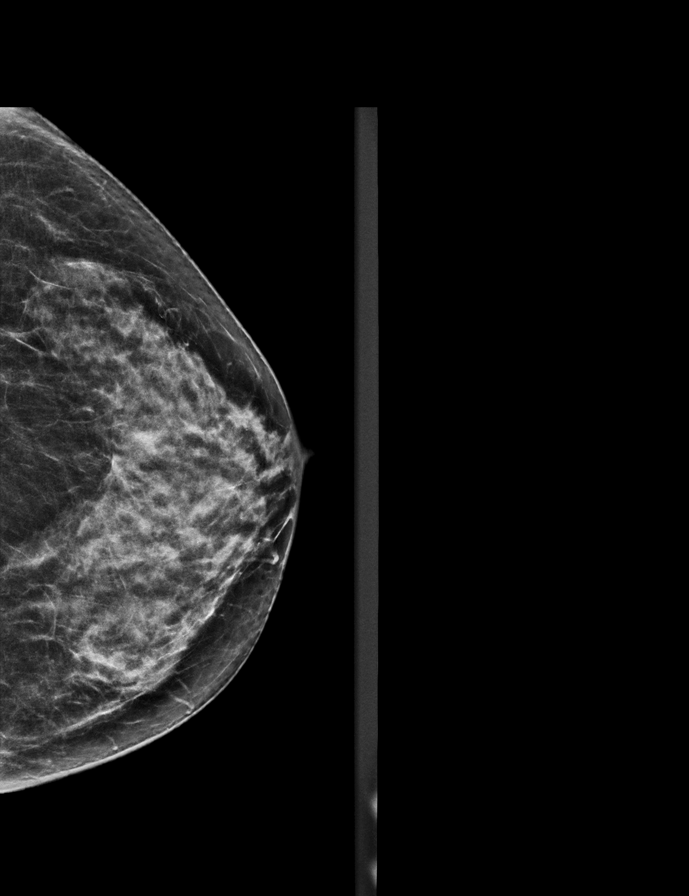

[L MLO tomo · 2 of 54 frames shown]
[frame 18/54]
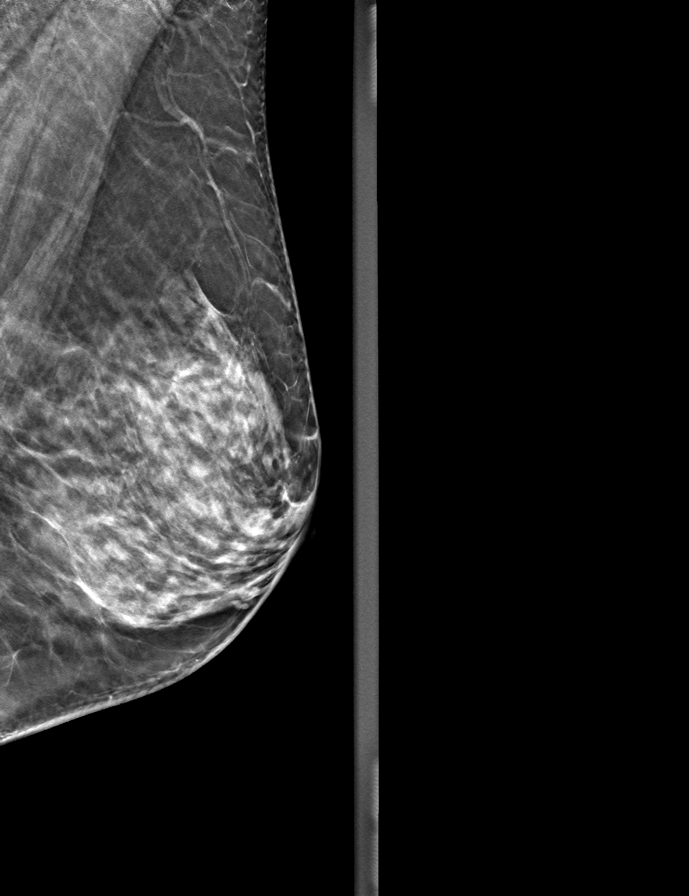
[frame 27/54]
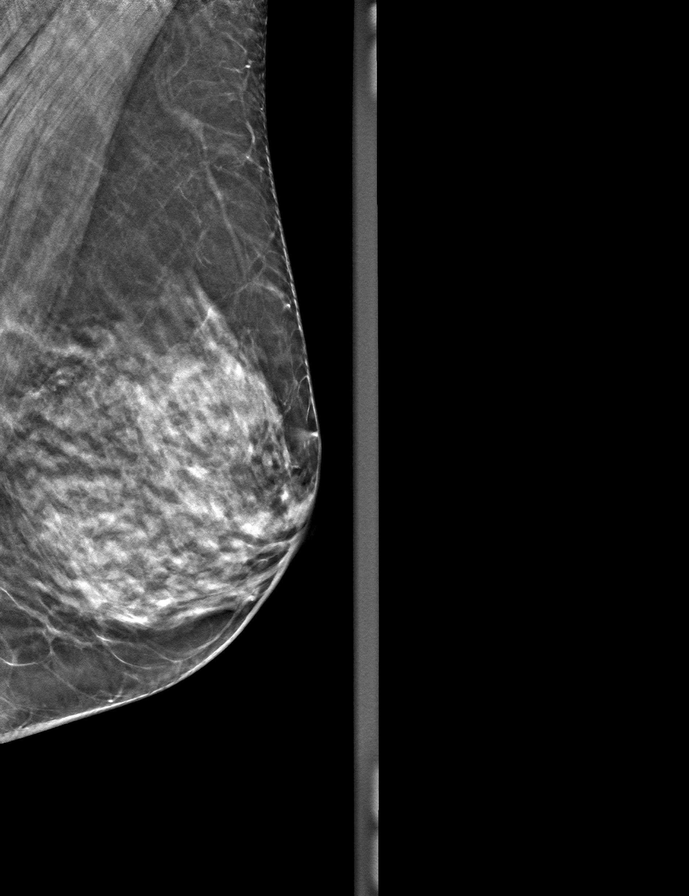

[R MLO tomo · tomo slice 27/53.0]
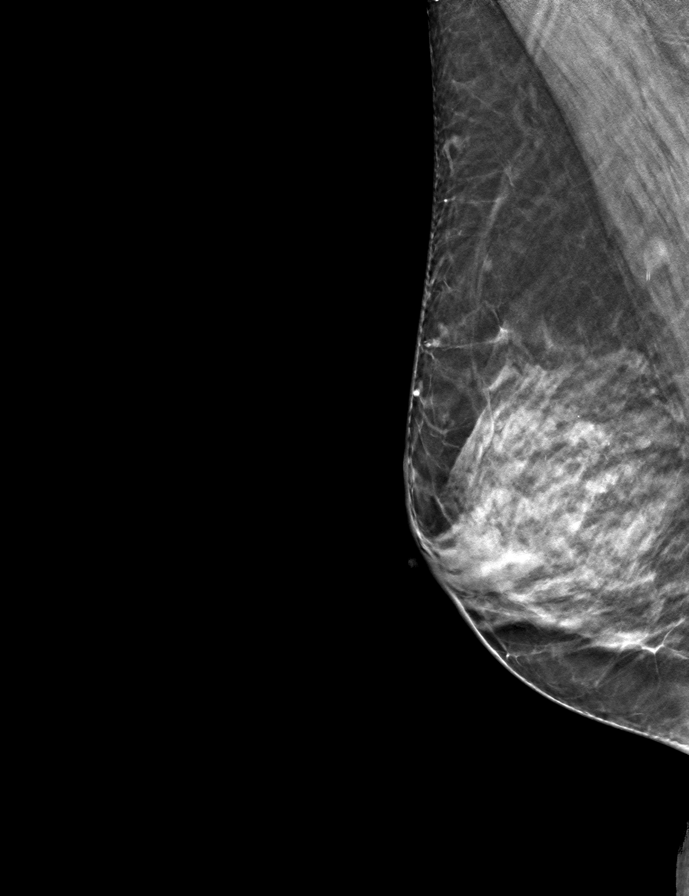

[R CC tomo · tomo slice 27/53.0]
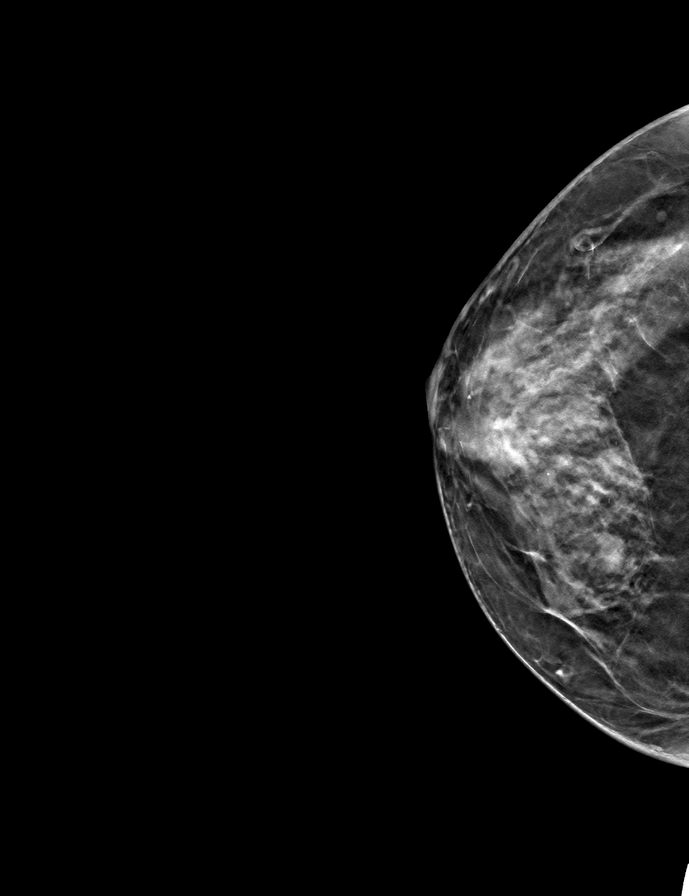

[L CC tomo · tomo slice 29/56.0]
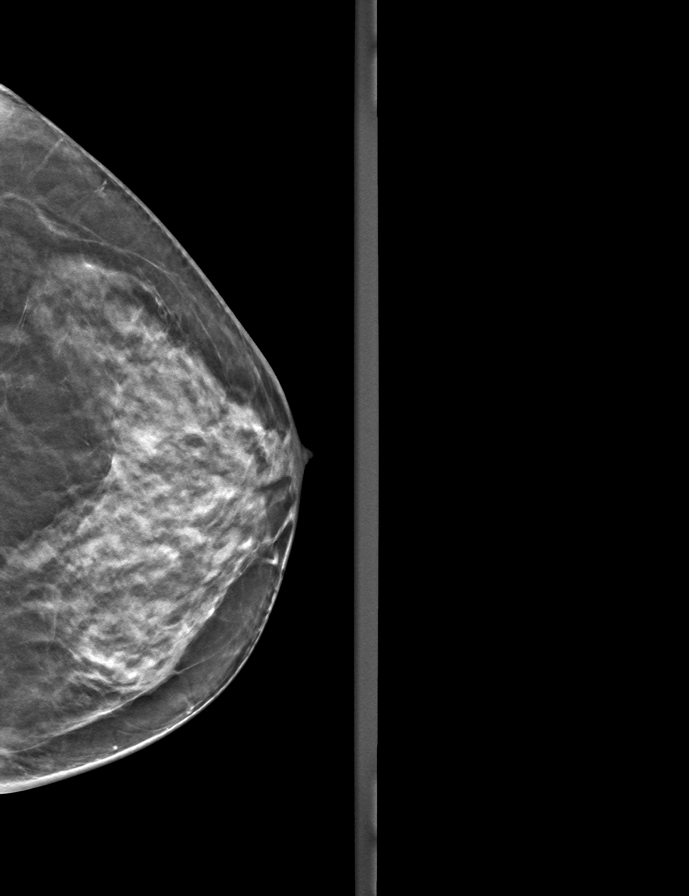

[9 of 24 positions shown; findings below may reference images not displayed]

ACR Breast Density Category c: The breast tissue is heterogeneously
dense, which may obscure small masses.
FINDINGS: There are no findings suspicious for malignancy.
IMPRESSION: No mammographic evidence of malignancy. A result letter of this
screening mammogram will be mailed directly to the patient.

RECOMMENDATION:
Screening mammogram in one year. (Code:[V2])

BI-RADS CATEGORY  1: Negative.

## 2020-12-22 ENCOUNTER — Ambulatory Visit: Payer: No Typology Code available for payment source

## 2021-01-18 ENCOUNTER — Other Ambulatory Visit: Payer: Self-pay | Admitting: Obstetrics and Gynecology

## 2021-01-18 DIAGNOSIS — Z8619 Personal history of other infectious and parasitic diseases: Secondary | ICD-10-CM

## 2021-01-18 NOTE — Telephone Encounter (Signed)
Spoke with patient and she did not request Rx. She is not having frequent outbreaks.

## 2021-01-18 NOTE — Telephone Encounter (Signed)
I sent in the valtrex for 90 tablets. At the time of her annual exam she only reported occasional outbreaks, #30 with 2 refills was sent. If she is using the valtrex a lot she may want to go on suppression.

## 2021-01-18 NOTE — Telephone Encounter (Signed)
Annual exam was on 10/18/20

## 2021-04-17 ENCOUNTER — Other Ambulatory Visit: Payer: Self-pay

## 2021-04-17 ENCOUNTER — Ambulatory Visit
Admission: RE | Admit: 2021-04-17 | Discharge: 2021-04-17 | Disposition: A | Discharge status: Home / Self Care | Payer: Commercial Managed Care - PPO | Source: Ambulatory Visit | Attending: Obstetrics and Gynecology | Admitting: Obstetrics and Gynecology

## 2021-04-17 DIAGNOSIS — Z803 Family history of malignant neoplasm of breast: Secondary | ICD-10-CM

## 2021-04-17 DIAGNOSIS — Z9189 Other specified personal risk factors, not elsewhere classified: Secondary | ICD-10-CM

## 2021-04-17 IMAGING — MR MR BREAST WO/W CM  BILAT
5 series · 30 of 48 positions shown · IV contrast (gadavist)
Comparison: previous MRI on [DATE], screening mammogram on
[DATE]

CLINICAL DATA: Abbreviated Breast MRI for breast cancer screening.
Increased risk for breast cancer. History of benign RIGHT breast
biopsy.

LABS:  None obtained at the time of imaging.
EXAM:
BILATERAL ABBREVIATED BREAST MRI WITH AND WITHOUT CONTRAST
TECHNIQUE: Multiplanar, multisequence MR images of both breasts were obtained
prior to and following the intravenous administration of 7 ml of
Gadavist

[Series 2: t2_tirm_tra ipat (a-p) · axial · 3.0mm · 0.70mm/px · z∈[-96,+72]mm · 5 of 57 slices shown]
[im 1/57]
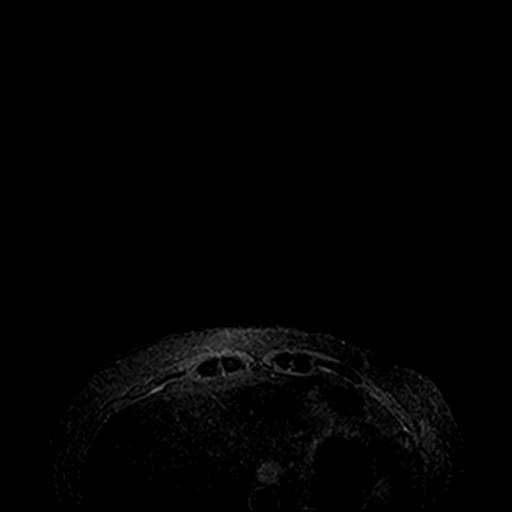
[im 15/57]
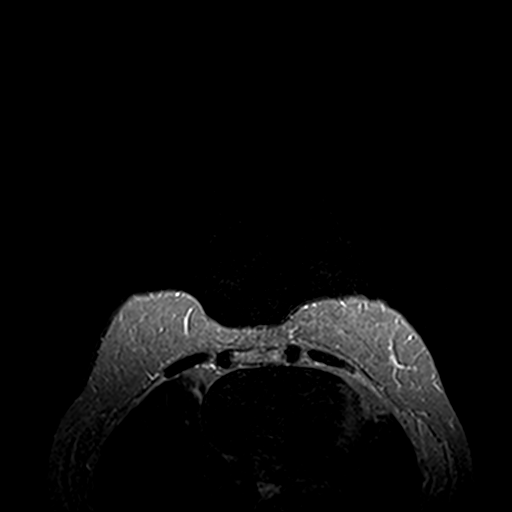
[im 29/57]
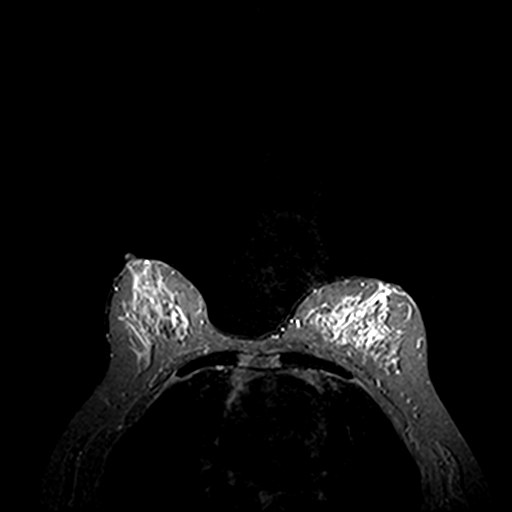
[im 43/57]
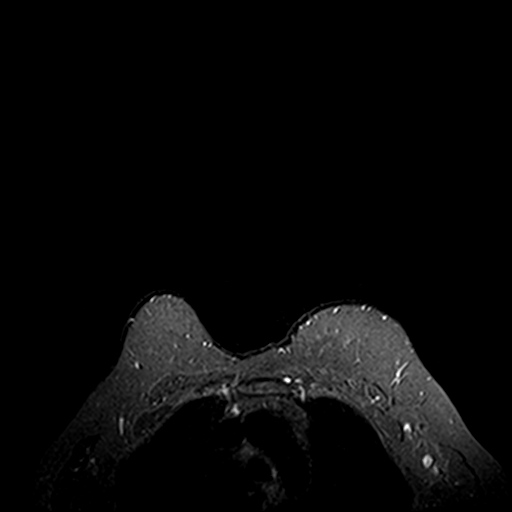
[im 57/57]
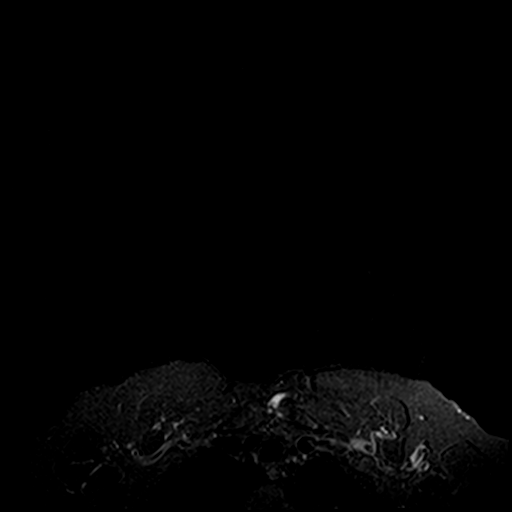

[Series 3: fl3d pre-cm · axial · non-contrast · 1.2mm · 0.94mm/px · z∈[-98,+74]mm · 8 of 144 slices shown]
[im 1/144]
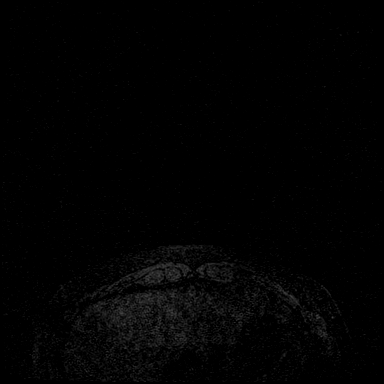
[im 23/144]
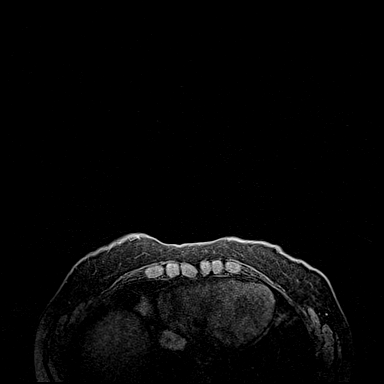
[im 45/144]
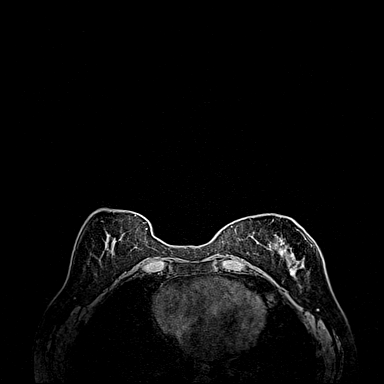
[im 67/144]
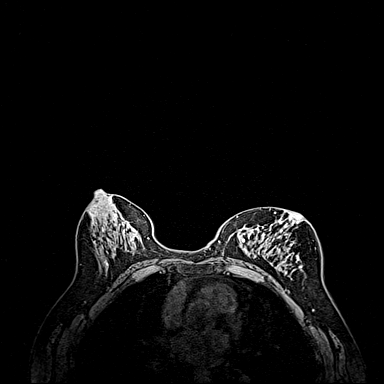
[im 78/144]
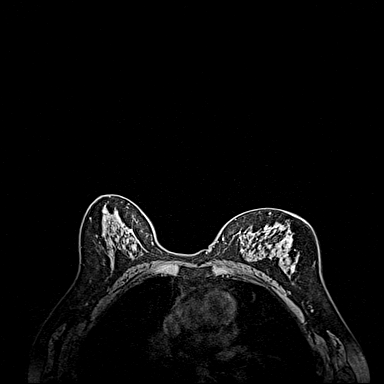
[im 100/144]
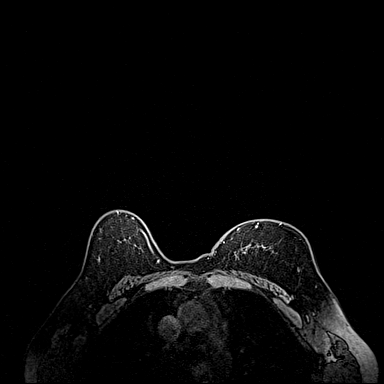
[im 122/144]
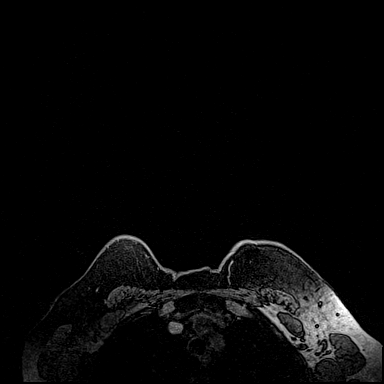
[im 144/144]
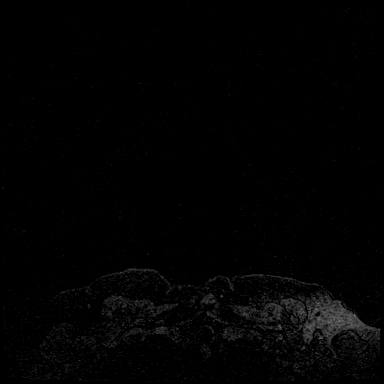

[Series 4: fl3d post immediate · axial · 1.2mm · 0.94mm/px · z∈[-98,+74]mm · 8 of 144 slices shown (1 of 3)]
[im 1/144]
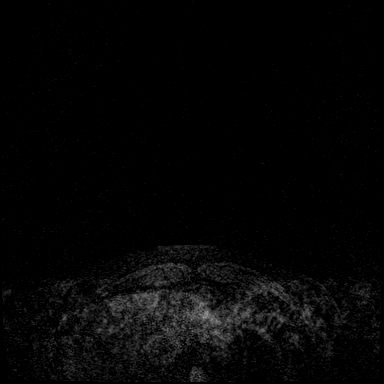
[im 23/144]
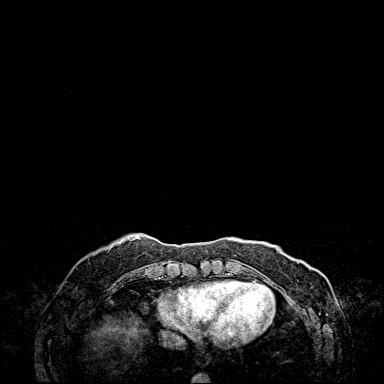
[im 45/144]
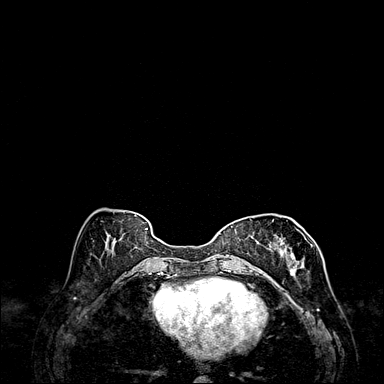
[im 67/144]
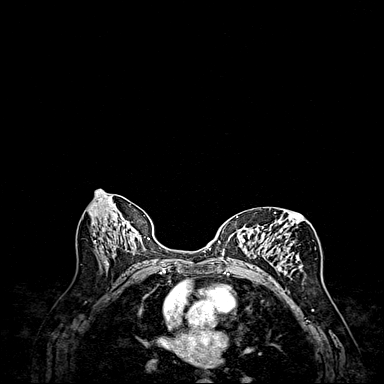
[im 78/144]
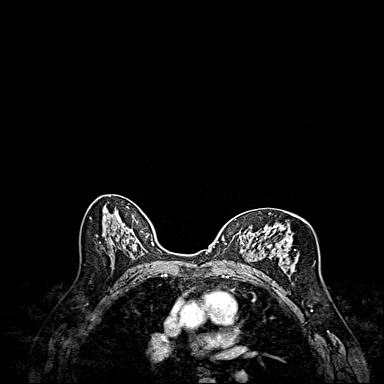
[im 100/144]
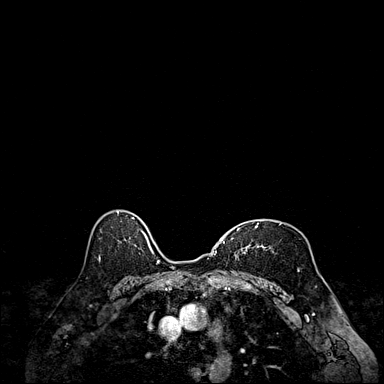
[im 122/144]
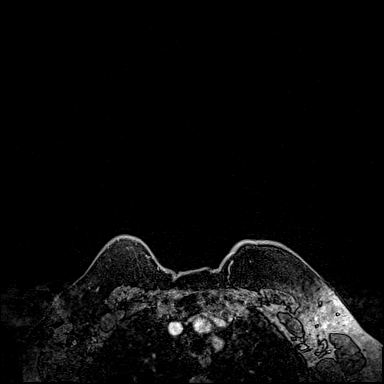
[im 144/144]
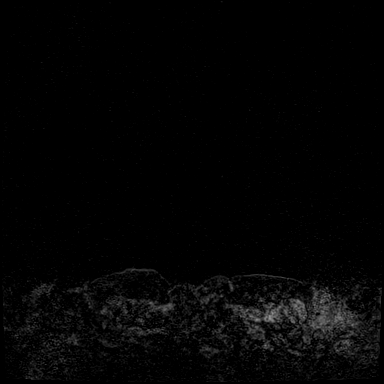

[Series 5: fl3d post immediate · axial · 1.2mm · 0.94mm/px · z∈[-98,+74]mm · 8 of 144 slices shown (2 of 3)]
[im 1/144]
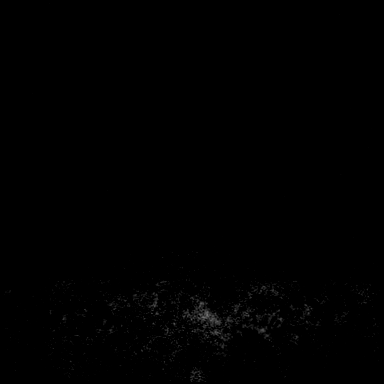
[im 23/144]
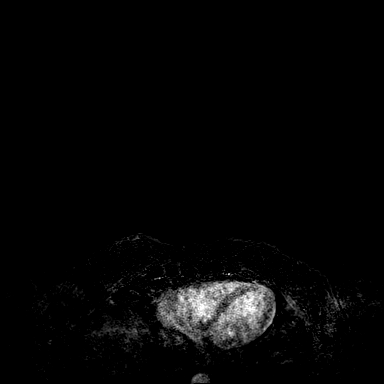
[im 45/144]
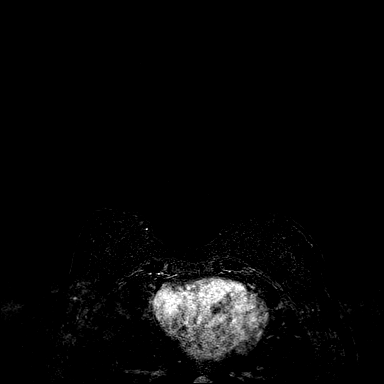
[im 67/144]
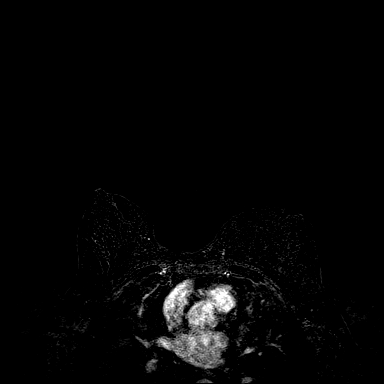
[im 78/144]
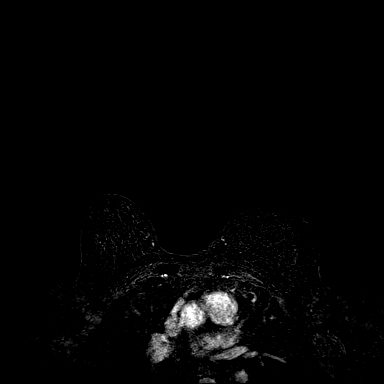
[im 100/144]
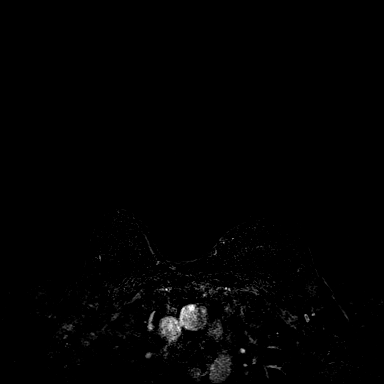
[im 122/144]
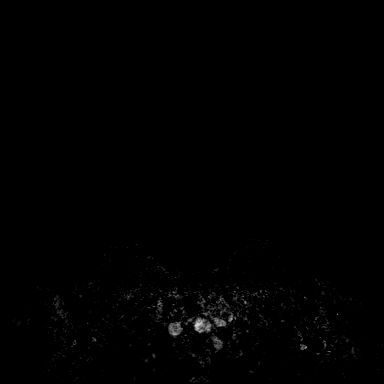
[im 144/144]
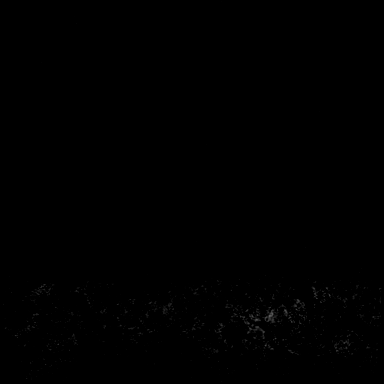

[Series 6: fl3d post immediate · axial · 172.8mm · 0.94mm/px · 1 of 1 slices shown (3 of 3)]
[im 1/1]
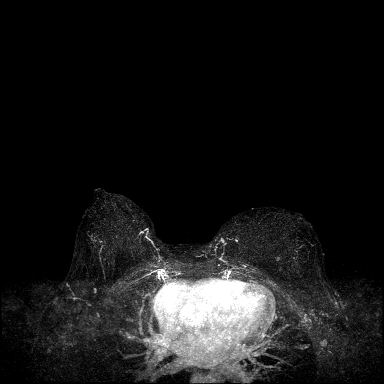

[30 of 48 positions shown; findings below may reference images not displayed]

Three-dimensional MR images were rendered by post-processing of the
original MR data on an independent workstation. The
three-dimensional MR images were interpreted, and findings are
reported in the following complete MRI report for this study. Three
dimensional images were evaluated at the independent [employer]
workstation
FINDINGS: Breast composition: c. Heterogeneous fibroglandular tissue.

Background parenchymal enhancement: Moderate.

Right breast: No mass or abnormal enhancement.

Left breast: No mass or abnormal enhancement.

Lymph nodes: No abnormal appearing lymph nodes.

Ancillary findings:  None.
IMPRESSION: No MRI evidence for malignancy in either breast.

RECOMMENDATION:
Recommend annual screening mammography, next in [DATE]. The
patient is also eligible for annual Abbreviated Breast MRI if she
wishes.

BI-RADS CATEGORY  1: Negative.

## 2021-04-17 MED ORDER — GADOBUTROL 1 MMOL/ML IV SOLN
7.0000 mL | Freq: Once | INTRAVENOUS | Status: AC | PRN
  Administered 2021-04-17: 7 mL via INTRAVENOUS

## 2021-05-29 ENCOUNTER — Other Ambulatory Visit (HOSPITAL_COMMUNITY): Payer: Self-pay | Admitting: Orthopedic Surgery

## 2021-06-19 ENCOUNTER — Other Ambulatory Visit: Payer: Self-pay

## 2021-06-19 ENCOUNTER — Encounter (HOSPITAL_BASED_OUTPATIENT_CLINIC_OR_DEPARTMENT_OTHER): Payer: Self-pay | Admitting: Orthopedic Surgery

## 2021-06-27 NOTE — Progress Notes (Signed)
°  Pt to finish drink by 0715 (or 2 hours prior to arrival if time is changed). Pt verbalized understanding.      Enhanced Recovery after Surgery for Orthopedics Enhanced Recovery after Surgery is a protocol used to improve the stress on your body and your recovery after surgery.  Patient Instructions  The night before surgery:  No food after midnight. ONLY clear liquids after midnight  The day of surgery (if you do NOT have diabetes):  Drink ONE (1) Pre-Surgery Clear Ensure as directed.   This drink was given to you during your hospital  pre-op appointment visit. The pre-op nurse will instruct you on the time to drink the  Pre-Surgery Ensure depending on your surgery time. Finish the drink at the designated time by the pre-op nurse.  Nothing else to drink after completing the  Pre-Surgery Clear Ensure.  The day of surgery (if you have diabetes): Drink ONE (1) Gatorade 2 (G2) as directed. This drink was given to you during your hospital  pre-op appointment visit.  The pre-op nurse will instruct you on the time to drink the   Gatorade 2 (G2) depending on your surgery time. Color of the Gatorade may vary. Red is not allowed. Nothing else to drink after completing the  Gatorade 2 (G2).         If you have questions, please contact your surgeons office.

## 2021-06-28 ENCOUNTER — Ambulatory Visit (HOSPITAL_BASED_OUTPATIENT_CLINIC_OR_DEPARTMENT_OTHER): Payer: 59 | Admitting: Certified Registered"

## 2021-06-28 ENCOUNTER — Ambulatory Visit (HOSPITAL_BASED_OUTPATIENT_CLINIC_OR_DEPARTMENT_OTHER)
Admission: RE | Admit: 2021-06-28 | Discharge: 2021-06-28 | Disposition: A | Discharge status: Home / Self Care | Payer: 59 | Attending: Orthopedic Surgery | Admitting: Orthopedic Surgery

## 2021-06-28 ENCOUNTER — Ambulatory Visit (HOSPITAL_BASED_OUTPATIENT_CLINIC_OR_DEPARTMENT_OTHER): Payer: 59

## 2021-06-28 ENCOUNTER — Encounter (HOSPITAL_BASED_OUTPATIENT_CLINIC_OR_DEPARTMENT_OTHER): Payer: Self-pay | Admitting: Orthopedic Surgery

## 2021-06-28 ENCOUNTER — Other Ambulatory Visit: Payer: Self-pay

## 2021-06-28 ENCOUNTER — Encounter (HOSPITAL_BASED_OUTPATIENT_CLINIC_OR_DEPARTMENT_OTHER)
Admission: RE | Disposition: A | Discharge status: Home / Self Care | Payer: Self-pay | Source: Home / Self Care | Attending: Orthopedic Surgery

## 2021-06-28 DIAGNOSIS — M2021 Hallux rigidus, right foot: Secondary | ICD-10-CM

## 2021-06-28 DIAGNOSIS — M25774 Osteophyte, right foot: Secondary | ICD-10-CM | POA: Insufficient documentation

## 2021-06-28 HISTORY — PX: ARTHRODESIS METATARSALPHALANGEAL JOINT (MTPJ): SHX6566

## 2021-06-28 SURGERY — FUSION, JOINT, GREAT TOE
Anesthesia: General | Site: Toe | Laterality: Right

## 2021-06-28 MED ORDER — VANCOMYCIN HCL 500 MG IV SOLR
INTRAVENOUS | Status: DC | PRN
  Administered 2021-06-28: 500 mg via TOPICAL

## 2021-06-28 MED ORDER — ACETAMINOPHEN 500 MG PO TABS
1000.0000 mg | ORAL_TABLET | Freq: Once | ORAL | Status: AC
  Administered 2021-06-28: 1000 mg via ORAL

## 2021-06-28 MED ORDER — LACTATED RINGERS IV BOLUS
1000.0000 mL | Freq: Once | INTRAVENOUS | Status: AC
  Administered 2021-06-28: 1000 mL via INTRAVENOUS

## 2021-06-28 MED ORDER — ONDANSETRON HCL 4 MG/2ML IJ SOLN
INTRAMUSCULAR | Status: DC | PRN
  Administered 2021-06-28: 4 mg via INTRAVENOUS

## 2021-06-28 MED ORDER — OXYCODONE HCL 5 MG PO TABS
5.0000 mg | ORAL_TABLET | Freq: Four times a day (QID) | ORAL | 0 refills | Status: AC | PRN
Start: 2021-06-28 — End: 2021-07-01

## 2021-06-28 MED ORDER — MIDAZOLAM HCL 2 MG/2ML IJ SOLN
INTRAMUSCULAR | Status: AC
  Filled 2021-06-28: qty 2

## 2021-06-28 MED ORDER — HYDROMORPHONE HCL 1 MG/ML IJ SOLN: 0.2500 mg | INTRAMUSCULAR | Status: DC | PRN

## 2021-06-28 MED ORDER — PROPOFOL 10 MG/ML IV BOLUS
INTRAVENOUS | Status: DC | PRN
  Administered 2021-06-28: 50 mg via INTRAVENOUS
  Administered 2021-06-28: 200 mg via INTRAVENOUS

## 2021-06-28 MED ORDER — FENTANYL CITRATE (PF) 100 MCG/2ML IJ SOLN
INTRAMUSCULAR | Status: AC
  Filled 2021-06-28: qty 2

## 2021-06-28 MED ORDER — CEFAZOLIN SODIUM-DEXTROSE 2-4 GM/100ML-% IV SOLN
2.0000 g | INTRAVENOUS | Status: AC
  Administered 2021-06-28: 2 g via INTRAVENOUS

## 2021-06-28 MED ORDER — FENTANYL CITRATE (PF) 100 MCG/2ML IJ SOLN
100.0000 ug | Freq: Once | INTRAMUSCULAR | Status: AC
  Administered 2021-06-28: 100 ug via INTRAVENOUS

## 2021-06-28 MED ORDER — LACTATED RINGERS IV SOLN: INTRAVENOUS | Status: DC

## 2021-06-28 MED ORDER — DEXAMETHASONE SODIUM PHOSPHATE 10 MG/ML IJ SOLN
INTRAMUSCULAR | Status: DC | PRN
  Administered 2021-06-28: 4 mg via INTRAVENOUS

## 2021-06-28 MED ORDER — ACETAMINOPHEN 500 MG PO TABS
ORAL_TABLET | ORAL | Status: AC
  Filled 2021-06-28: qty 2

## 2021-06-28 MED ORDER — 0.9 % SODIUM CHLORIDE (POUR BTL) OPTIME
TOPICAL | Status: DC | PRN
  Administered 2021-06-28: 200 mL

## 2021-06-28 MED ORDER — DOCUSATE SODIUM 100 MG PO CAPS
100.0000 mg | ORAL_CAPSULE | Freq: Two times a day (BID) | ORAL | 0 refills | Status: DC
Start: 2021-06-28 — End: 2023-02-11

## 2021-06-28 MED ORDER — BUPIVACAINE-EPINEPHRINE (PF) 0.5% -1:200000 IJ SOLN
INTRAMUSCULAR | Status: DC | PRN
Start: 2021-06-28 — End: 2021-06-28
  Administered 2021-06-28: 30 mL via PERINEURAL

## 2021-06-28 MED ORDER — MIDAZOLAM HCL 2 MG/2ML IJ SOLN
2.0000 mg | Freq: Once | INTRAMUSCULAR | Status: AC
  Administered 2021-06-28: 2 mg via INTRAVENOUS

## 2021-06-28 MED ORDER — LIDOCAINE HCL (CARDIAC) PF 100 MG/5ML IV SOSY
PREFILLED_SYRINGE | INTRAVENOUS | Status: DC | PRN
  Administered 2021-06-28: 30 mg via INTRAVENOUS

## 2021-06-28 MED ORDER — CEFAZOLIN SODIUM-DEXTROSE 2-4 GM/100ML-% IV SOLN
INTRAVENOUS | Status: AC
  Filled 2021-06-28: qty 100

## 2021-06-28 MED ORDER — SENNA 8.6 MG PO TABS
2.0000 | ORAL_TABLET | Freq: Two times a day (BID) | ORAL | 0 refills | Status: DC
Start: 2021-06-28 — End: 2021-10-24

## 2021-06-28 MED ORDER — SODIUM CHLORIDE 0.9 % IV SOLN: INTRAVENOUS | Status: DC

## 2021-06-28 MED ORDER — PROPOFOL 500 MG/50ML IV EMUL
INTRAVENOUS | Status: DC | PRN
  Administered 2021-06-28: 25 ug/kg/min via INTRAVENOUS

## 2021-06-28 SURGICAL SUPPLY — 78 items
APL PRP STRL LF DISP 70% ISPRP (MISCELLANEOUS) ×1
BANDAGE ESMARK 6X9 LF (GAUZE/BANDAGES/DRESSINGS) IMPLANT
BLADE AVERAGE 25X9 (BLADE) IMPLANT
BLADE MICRO SAGITTAL (BLADE) IMPLANT
BLADE OSC/SAG .038X5.5 CUT EDG (BLADE) IMPLANT
BLADE SURG 15 STRL LF DISP TIS (BLADE) ×4 IMPLANT
BLADE SURG 15 STRL SS (BLADE) ×4
BNDG CMPR 9X6 STRL LF SNTH (GAUZE/BANDAGES/DRESSINGS)
BNDG COHESIVE 4X5 TAN ST LF (GAUZE/BANDAGES/DRESSINGS) IMPLANT
BNDG COHESIVE 6X5 TAN ST LF (GAUZE/BANDAGES/DRESSINGS) IMPLANT
BNDG CONFORM 3 STRL LF (GAUZE/BANDAGES/DRESSINGS) ×3 IMPLANT
BNDG ELASTIC 4X5.8 VLCR STR LF (GAUZE/BANDAGES/DRESSINGS) ×3 IMPLANT
BNDG ESMARK 6X9 LF (GAUZE/BANDAGES/DRESSINGS)
BOOT STEPPER DURA LG (SOFTGOODS) IMPLANT
BOOT STEPPER DURA MED (SOFTGOODS) IMPLANT
BOOT STEPPER DURA SM (SOFTGOODS) IMPLANT
BOOT STEPPER DURA XLG (SOFTGOODS) IMPLANT
CHLORAPREP W/TINT 26 (MISCELLANEOUS) ×3 IMPLANT
COVER BACK TABLE 60X90IN (DRAPES) ×3 IMPLANT
CUFF TOURN SGL QUICK 30 NS (TOURNIQUET CUFF) ×1 IMPLANT
CUFF TOURN SGL QUICK 34 (TOURNIQUET CUFF)
CUFF TRNQT CYL 34X4.125X (TOURNIQUET CUFF) IMPLANT
DRAPE EXTREMITY T 121X128X90 (DISPOSABLE) ×3 IMPLANT
DRAPE OEC MINIVIEW 54X84 (DRAPES) ×3 IMPLANT
DRAPE U-SHAPE 47X51 STRL (DRAPES) ×3 IMPLANT
DRSG MEPITEL 4X7.2 (GAUZE/BANDAGES/DRESSINGS) ×3 IMPLANT
DRSG PAD ABDOMINAL 8X10 ST (GAUZE/BANDAGES/DRESSINGS) ×3 IMPLANT
ELECT REM PT RETURN 9FT ADLT (ELECTROSURGICAL) ×2
ELECTRODE REM PT RTRN 9FT ADLT (ELECTROSURGICAL) ×2 IMPLANT
GAUZE SPONGE 4X4 12PLY STRL (GAUZE/BANDAGES/DRESSINGS) ×3 IMPLANT
GLOVE SRG 8 PF TXTR STRL LF DI (GLOVE) ×4 IMPLANT
GLOVE SURG ENC MOIS LTX SZ8 (GLOVE) ×3 IMPLANT
GLOVE SURG LTX SZ8 (GLOVE) ×3 IMPLANT
GLOVE SURG POLYISO LF SZ7 (GLOVE) ×1 IMPLANT
GLOVE SURG UNDER POLY LF SZ7 (GLOVE) ×2 IMPLANT
GLOVE SURG UNDER POLY LF SZ8 (GLOVE) ×4
GOWN STRL REUS W/ TWL LRG LVL3 (GOWN DISPOSABLE) ×2 IMPLANT
GOWN STRL REUS W/ TWL XL LVL3 (GOWN DISPOSABLE) ×4 IMPLANT
GOWN STRL REUS W/TWL LRG LVL3 (GOWN DISPOSABLE) ×2
GOWN STRL REUS W/TWL XL LVL3 (GOWN DISPOSABLE) ×4
K-WIRE ACE 1.6X6 (WIRE) ×2
KWIRE ACE 1.6X6 (WIRE) IMPLANT
NEEDLE HYPO 22GX1.5 SAFETY (NEEDLE) IMPLANT
PACK BASIN DAY SURGERY FS (CUSTOM PROCEDURE TRAY) ×3 IMPLANT
PAD CAST 4YDX4 CTTN HI CHSV (CAST SUPPLIES) ×2 IMPLANT
PADDING CAST ABS 4INX4YD NS (CAST SUPPLIES)
PADDING CAST ABS COTTON 4X4 ST (CAST SUPPLIES) IMPLANT
PADDING CAST COTTON 4X4 STRL (CAST SUPPLIES) ×2
PADDING CAST COTTON 6X4 STRL (CAST SUPPLIES) IMPLANT
PENCIL SMOKE EVACUATOR (MISCELLANEOUS) ×3 IMPLANT
PLATE TUB 39 W/COLLAR 5H (Plate) ×1 IMPLANT
SANITIZER HAND PURELL 535ML FO (MISCELLANEOUS) ×3 IMPLANT
SCREW 2.7X16MM (Screw) ×4 IMPLANT
SCREW CANN 4.0X32 BIODUR 1/3 (Screw) ×1 IMPLANT
SCREW CORT 2.5X20X2.7XST SM (Screw) IMPLANT
SCREW CORTICAL 2.7X14MM (Screw) ×1 IMPLANT
SCREW CORTICAL 2.7X20MM (Screw) ×2 IMPLANT
SCREW NLOCK CORT 2.7X16 NS (Screw) IMPLANT
SHEET MEDIUM DRAPE 40X70 STRL (DRAPES) ×3 IMPLANT
SLEEVE SCD COMPRESS KNEE MED (STOCKING) ×3 IMPLANT
SPLINT FAST PLASTER 5X30 (CAST SUPPLIES)
SPLINT PLASTER CAST FAST 5X30 (CAST SUPPLIES) IMPLANT
SPONGE SURGIFOAM ABS GEL 12-7 (HEMOSTASIS) IMPLANT
SPONGE T-LAP 18X18 ~~LOC~~+RFID (SPONGE) ×3 IMPLANT
STOCKINETTE 6  STRL (DRAPES) ×2
STOCKINETTE 6 STRL (DRAPES) ×2 IMPLANT
SUCTION FRAZIER HANDLE 10FR (MISCELLANEOUS) ×4
SUCTION TUBE FRAZIER 10FR DISP (MISCELLANEOUS) ×2 IMPLANT
SUT ETHILON 3 0 PS 1 (SUTURE) ×3 IMPLANT
SUT MNCRL AB 3-0 PS2 18 (SUTURE) ×3 IMPLANT
SUT VIC AB 2-0 SH 27 (SUTURE) ×2
SUT VIC AB 2-0 SH 27XBRD (SUTURE) ×2 IMPLANT
SUT VICRYL 0 SH 27 (SUTURE) IMPLANT
SYR BULB EAR ULCER 3OZ GRN STR (SYRINGE) ×3 IMPLANT
SYR CONTROL 10ML LL (SYRINGE) IMPLANT
TOWEL GREEN STERILE FF (TOWEL DISPOSABLE) ×5 IMPLANT
TUBE CONNECTING 20X1/4 (TUBING) ×3 IMPLANT
UNDERPAD 30X36 HEAVY ABSORB (UNDERPADS AND DIAPERS) ×3 IMPLANT

## 2021-06-28 NOTE — Anesthesia Preprocedure Evaluation (Addendum)
Anesthesia Evaluation  Patient identified by MRN, date of birth, ID band Patient awake    Reviewed: Allergy & Precautions, H&P , NPO status , Patient's Chart, lab work & pertinent test results  Airway Mallampati: II  TM Distance: >3 FB Neck ROM: Full    Dental no notable dental hx. (+) Teeth Intact, Dental Advisory Given   Pulmonary asthma ,    Pulmonary exam normal breath sounds clear to auscultation       Cardiovascular negative cardio ROS   Rhythm:Regular Rate:Normal     Neuro/Psych negative neurological ROS  negative psych ROS   GI/Hepatic negative GI ROS, Neg liver ROS,   Endo/Other  negative endocrine ROS  Renal/GU negative Renal ROS  negative genitourinary   Musculoskeletal  (+) Arthritis , Osteoarthritis,    Abdominal   Peds  Hematology  (+) Blood dyscrasia, anemia ,   Anesthesia Other Findings   Reproductive/Obstetrics negative OB ROS                            Anesthesia Physical Anesthesia Plan  ASA: 2  Anesthesia Plan: General   Post-op Pain Management: Regional block and Tylenol PO (pre-op)   Induction: Intravenous  PONV Risk Score and Plan: 4 or greater and Ondansetron, Dexamethasone, Midazolam and Propofol infusion  Airway Management Planned: LMA  Additional Equipment:   Intra-op Plan:   Post-operative Plan: Extubation in OR  Informed Consent: I have reviewed the patients History and Physical, chart, labs and discussed the procedure including the risks, benefits and alternatives for the proposed anesthesia with the patient or authorized representative who has indicated his/her understanding and acceptance.     Dental advisory given  Plan Discussed with: CRNA  Anesthesia Plan Comments:         Anesthesia Quick Evaluation

## 2021-06-28 NOTE — Anesthesia Postprocedure Evaluation (Signed)
Anesthesia Post Note  Patient: Katherine Ritter  Procedure(s) Performed: Right hallux metatarsophalangeal joint arthrodesis (Right: Toe)     Patient location during evaluation: PACU Anesthesia Type: General and Regional Level of consciousness: awake and alert Pain management: pain level controlled Vital Signs Assessment: post-procedure vital signs reviewed and stable Respiratory status: spontaneous breathing, nonlabored ventilation and respiratory function stable Cardiovascular status: blood pressure returned to baseline and stable Postop Assessment: no apparent nausea or vomiting Anesthetic complications: no   No notable events documented.  Last Vitals:  Vitals:   06/28/21 1300 06/28/21 1315  BP: 92/67   Pulse: 70 70  Resp: 19 11  Temp:    SpO2: 100% 100%    Last Pain:  Vitals:   06/28/21 1315  TempSrc:   PainSc: 0-No pain                 Horrace Hanak,W. EDMOND

## 2021-06-28 NOTE — Anesthesia Procedure Notes (Signed)
Procedure Name: LMA Insertion Date/Time: 06/28/2021 11:39 AM Performed by: Signe Colt, CRNA Pre-anesthesia Checklist: Patient identified, Emergency Drugs available, Suction available and Patient being monitored Patient Re-evaluated:Patient Re-evaluated prior to induction Oxygen Delivery Method: Circle System Utilized Preoxygenation: Pre-oxygenation with 100% oxygen Induction Type: IV induction Ventilation: Mask ventilation without difficulty LMA: LMA inserted LMA Size: 4.0 Number of attempts: 1 Airway Equipment and Method: bite block Placement Confirmation: positive ETCO2 Tube secured with: Tape Dental Injury: Teeth and Oropharynx as per pre-operative assessment

## 2021-06-28 NOTE — H&P (Signed)
Katherine Ritter is an 56 y.o. female.   Chief Complaint: right foot pain HPI:  57 y/o female without PMH c/o R foot pain worsening for several years.  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  She presents today for right hallux MPJ arthrodesis.  Past Medical History:  Diagnosis Date   Acute allergic rhinitis    Anemia    Asthma    BMI 24.0-24.9, adult    Cancer ([place])    skin cancer   Dizziness    "I've had inner ear before"   Family history of breast cancer    Family history of lung cancer    Family history of throat cancer    Personal history of skin cancer    Syncope     Past Surgical History:  Procedure Laterality Date   BREAST BIOPSY Right 2016   COLONOSCOPY  last 06/18/2013   POLYPECTOMY     skin cancer removal     x4    Family History  Problem Relation Age of Onset   Lung cancer Mother    Heart attack Mother    Colon polyps Mother    Throat cancer Father    Dementia Father    Heart disease Father    Stroke Father    Breast cancer Paternal Aunt 8   Thyroid disease Paternal Aunt    Breast cancer Paternal Grandmother 20   Dementia Maternal Grandmother    Emphysema Maternal Grandfather    Heart attack Paternal Grandfather    Seizures Neg Hx    Colon cancer Neg Hx    Esophageal cancer Neg Hx    Rectal cancer Neg Hx    Stomach cancer Neg Hx    Social History:  reports that she has never smoked. She has never used smokeless tobacco. She reports current alcohol use of about 2.0 standard drinks per week. She reports that she does not use drugs.  Allergies: No Known Allergies  Medications Prior to Admission  Medication Sig Dispense Refill   Cholecalciferol (VITAMIN D3 PO) Take by mouth.     MAGNESIUM PO Take by mouth.     PRAVASTATIN SODIUM PO Take by mouth.     vitamin C (ASCORBIC ACID) 500 MG tablet Take 500 mg by mouth daily.     valACYclovir (VALTREX) 500 MG tablet TAKE 4 TABLETS BY MOUTH EVERY 12  HOURS FOR 2 DOSES AS NEEDED 90 tablet 0    No results found for this or any previous visit (from the past 48 hour(s)). No results found.  Review of Systems  no recent f/c/n/v/wt loss.  Blood pressure 102/69, pulse 86, temperature 98.4 F (36.9 C), temperature source Oral, resp. rate 18, height 5\' 5"  (1.651 m), weight 67 kg, last menstrual period 12/01/2011, SpO2 100 %. Physical Exam  Wn wd woman in nad.  A and O x 4.  Normal mood and affect.  EOMI.  Resp unlabored.  R forefoot with healthyskin.  Pulses are palpable.  Intact sens to LT dorsally and plantarly.  Decreased ROM at the hallux MPJ.  Assessment/Plan R hallux rigidus - to OR today for R hallux MPJ arthrodesis.  The risks and benefits of the alternative treatment options have been discussed in detail.  The patient wishes to proceed with surgery and specifically understands risks of bleeding, infection, nerve damage, blood clots, need for additional surgery, amputation and death.   [name], MD 26-Jul-2021, 11:19 AM

## 2021-06-28 NOTE — Progress Notes (Signed)
Assisted Dr. Oren Bracket with right, ultrasound guided, popliteal block. Side rails up, monitors on throughout procedure. See vital signs in flow sheet. Tolerated Procedure well.

## 2021-06-28 NOTE — Transfer of Care (Signed)
Immediate Anesthesia Transfer of Care Note  Patient: Katherine Ritter [facility]  Procedure(s) Performed: Right hallux metatarsophalangeal joint arthrodesis (Right: Toe)  Patient Location: PACU  Anesthesia Type:GA combined with regional for post-op pain  Level of Consciousness: drowsy and patient cooperative  Airway & Oxygen Therapy: Patient Spontanous Breathing and Patient connected to face mask oxygen  Post-op Assessment: Report given to RN and Post -op Vital signs reviewed and stable  Post vital signs: Reviewed and stable  Last Vitals:  Vitals Value Taken Time  BP    Temp    Pulse 58 06/28/21 1230  Resp 11 06/28/21 1230  SpO2 100 % 06/28/21 1230  Vitals shown include unvalidated device data.  Last Pain:  Vitals:   06/28/21 0919  TempSrc: Oral  PainSc: 0-No pain         Complications: No notable events documented.

## 2021-06-28 NOTE — Anesthesia Procedure Notes (Signed)
Anesthesia Regional Block: Popliteal block   Pre-Anesthetic Checklist: , timeout performed,  Correct Patient, Correct Site, Correct Laterality,  Correct Procedure, Correct Position, site marked,  Risks and benefits discussed,  Pre-op evaluation,  At surgeon's request and post-op pain management  Laterality: Right  Prep: Maximum Sterile Barrier Precautions used, chloraprep       Needles:  Injection technique: Single-shot  Needle Type: Echogenic Stimulator Needle     Needle Length: 9cm  Needle Gauge: 21     Additional Needles:   Procedures:,,,, ultrasound used (permanent image in chart),,    Narrative:  Start time: 06/28/2021 9:36 AM End time: 06/28/2021 9:46 AM Injection made incrementally with aspirations every 5 mL. Anesthesiologist: Roderic Palau, MD

## 2021-06-28 NOTE — Op Note (Signed)
06/28/2021  12:39 PM  PATIENT:  Katherine Ritter  56 y.o. female  PRE-OPERATIVE DIAGNOSIS:  right foot hallux rigidus  POST-OPERATIVE DIAGNOSIS:  right foot hallux rigidus  Procedure(s): 1.  Right hallux metatarsophalangeal joint arthrodesis   2.  Right foot AP and lateral xrays  SURGEON:  [name], MD  ASSISTANT: Mechele Claude, PA-C  ANESTHESIA:   General, regional  EBL:  minimal   TOURNIQUET:   Total Tourniquet Time Documented: Thigh (Right) - 26 minutes Total: Thigh (Right) - 26 minutes  COMPLICATIONS:  None apparent  DISPOSITION:  Extubated, awake and stable to recovery.  INDICATION FOR PROCEDURE: The patient is a 56 year old female with a long history of right forefoot pain due to hallux rigidus.  She has failed nonoperative treatment to date including activity modification, oral anti-inflammatories and shoewear modification.  She presents now for surgical treatment of this painful and limiting condition.  The risks and benefits of the alternative treatment options have been discussed in detail.  The patient wishes to proceed with surgery and specifically understands risks of bleeding, infection, nerve damage, blood clots, need for additional surgery, amputation and death.   PROCEDURE IN DETAIL:  After pre operative consent was obtained, and the correct operative site was identified, the patient was brought to the operating room and placed supine on the OR table.  Anesthesia was administered.  Pre-operative antibiotics were administered.  A surgical timeout was taken.  The right lower extremity was prepped and draped in standard sterile fashion with a tourniquet around the thigh.  The extremity was elevated and the tourniquet was inflated to 250 mmHg.  A longitudinal incision was made over the hallux MP joint.  Dissection was carried sharply down through the subcutaneous tissues.  The extensor tendons were retracted laterally and the collateral ligaments were released.   The head of the metatarsal was exposed.  A concave reamer was used to remove the remaining articular cartilage and subchondral bone.  Dorsal osteophytes were removed with a rondure.  A convex reamer was used to remove the remaining articular cartilage and subchondral bone from the base of the proximal phalanx.  Peripheral osteophytes were debrided.  The wound was irrigated copiously.  A small drill bit was used to perforate both sides of the joint leaving the resultant bone graft in place.  The joint was reduced and provisionally pinned.  Radiographs and a simulated weightbearing examination showed appropriate position of the toe and of the guidewire.  The guidewire was overdrilled and a 4 mm partially-threaded [employer] stainless steel cannulated screw was inserted.  It was noted to compress the arthrodesis site appropriately and have excellent purchase.  A 5 hole one quarter tubular plate from the [employer] stainless steel mini frag set was then applied to the dorsum of the joint.  It was secured proximally and distally with 2 bicortical screws on each side of the joint.  AP and lateral radiographs showed appropriate reduction of the joint and appropriate position and length of all hardware.  Wound was irrigated copiously and sprinkled with vancomycin powder.  The dorsal joint capsule was repaired with Vicryl.  Skin incision was closed with 3-0 nylon.  Sterile dressings were applied followed by compression wrap and a cam boot.  The tourniquet was released after application of the dressings.  The patient was awakened from anesthesia and transported to the recovery room in stable condition.   FOLLOW UP PLAN: Weightbearing as tolerated on the right heel in a cam boot.  Follow-up in the office in 2 weeks for suture removal.  Plan 6 weeks postoperative weightbearing immobilization.  No indication for DVT prophylaxis in this ambulatory patient.   RADIOGRAPHS: AP and lateral radiographs of the right foot  are obtained intraoperatively.  These show interval arthrodesis of the hallux MP joint.  Hardware is appropriately positioned and of the appropriate lengths.  No other acute injuries are noted.    Mechele Claude PA-C was present and scrubbed for the duration of the operative case. His assistance was essential in positioning the patient, prepping and draping, gaining and maintaining exposure, performing the operation, closing and dressing the wounds and applying the splint.

## 2021-06-28 NOTE — Discharge Instructions (Addendum)
Next dose of Tylenol at 3:30 today if needed   [name], MD [facility]  Please read the following information regarding your care after surgery.  Medications  You only need a prescription for the narcotic pain medicine (ex. oxycodone, Percocet, Norco).  All of the other medicines listed below are available over the counter. ? Aleve 2 pills twice a day for the first 3 days after surgery. ? acetominophen (Tylenol) 650 mg every 4-6 hours as you need for minor to moderate pain ? oxycodone as prescribed for severe pain  Narcotic pain medicine (ex. oxycodone, Percocet, Vicodin) will cause constipation.  To prevent this problem, take the following medicines while you are taking any pain medicine. ? docusate sodium (Colace) 100 mg twice a day ? senna (Senokot) 2 tablets twice a day   Weight Bearing ? Bear weight only on your operated foot in the CAM boot.   Cast / Splint / Dressing ? Keep your splint, cast or dressing clean and dry.  Dont put anything (coat hanger, pencil, etc) down inside of it.  If it gets damp, use a hair dryer on the cool setting to dry it.  If it gets soaked, call the office to schedule an appointment for a cast change.   After your dressing, cast or splint is removed; you may shower, but do not soak or scrub the wound.  Allow the water to run over it, and then gently pat it dry.  Swelling It is normal for you to have swelling where you had surgery.  To reduce swelling and pain, keep your toes above your nose for at least 3 days after surgery.  It may be necessary to keep your foot or leg elevated for several weeks.  If it hurts, it should be elevated.  Follow Up Call my office at [phone] when you are discharged from the hospital or surgery center to schedule an appointment to be seen two weeks after surgery.  Call my office at [phone] if you develop a fever >101.5 F, nausea, vomiting, bleeding from the surgical site or severe pain.    Regional  Anesthesia Blocks  1. Numbness or the inability to move the "blocked" extremity may last from 3-48 hours after placement. The length of time depends on the medication injected and your individual response to the medication. If the numbness is not going away after 48 hours, call your surgeon.  2. The extremity that is blocked will need to be protected until the numbness is gone and the  Strength has returned. Because you cannot feel it, you will need to take extra care to avoid injury. Because it may be weak, you may have difficulty moving it or using it. You may not know what position it is in without looking at it while the block is in effect.  3. For blocks in the legs and feet, returning to weight bearing and walking needs to be done carefully. You will need to wait until the numbness is entirely gone and the strength has returned. You should be able to move your leg and foot normally before you try and bear weight or walk. You will need someone to be with you when you first try to ensure you do not fall and possibly risk injury.  4. Bruising and tenderness at the needle site are common side effects and will resolve in a few days.  5. Persistent numbness or new problems with movement should be communicated to the surgeon or the [place] (  [phone])/ [place] [phone]).

## 2021-06-29 ENCOUNTER — Encounter (HOSPITAL_BASED_OUTPATIENT_CLINIC_OR_DEPARTMENT_OTHER): Payer: Self-pay | Admitting: Orthopedic Surgery

## 2021-06-29 NOTE — Progress Notes (Signed)
Left message stating courtesy call and if any questions or concerns please call the doctors office.  

## 2021-10-17 NOTE — Progress Notes (Addendum)
56 y.o. [id number] Significant Other White or Caucasian Not Hispanic or Latino female here for annual exam.  No vaginal bleeding. No dyspareunia. Tolerable vasomotor symptoms.  ?She is taking magnesium daily which helps with her BM's. ? ?Occasional mixed incontinence, she has seen Urology.  ?Symptoms are tolerable.  ? ?H/O fatty liver. ? ?H/O cold sores, gets occasional outbreaks.  ? ?She has a strong FH of breast cancer.  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 also an association with ovarian cancer, data is limitied.  ?Above general population risk  ?TC breast cancer risk is 16.9% (prior to genetic testing)  ? ?Patient's last menstrual period was 12/01/2011.          ?Sexually active: Yes.    ?The current method of family planning is post menopausal status.    ?Exercising: No.  The patient does not participate in regular exercise at present. ?Smoker:  no ? ?Health Maintenance: ?Pap:  12/16/2016 neg pap/neg HPV, 2016 WNL per patient   ?History of abnormal Pap:  Yes dysplasia years ago, no surgery on her cervix   ?MMG:  12/22/20  BI-RADS cat 1 ?Mr breast: 04/17/21 Bi-rads 1 neg  ?BMD:   none  ?Colonoscopy: 08/19/18, f/u in 5 years. ?TDaP:  12/17/17 ?Gardasil: n/a ? ? reports that she has never smoked. She has never used smokeless tobacco. She reports current alcohol use of about 2.0 standard drinks per week. She reports that she does not use drugs. She works in Orthoptist. 2 grown sons, one is married, no grandchildren.  ? ?Past Medical History:  ?Diagnosis Date  ? Acute allergic rhinitis   ? Anemia   ? Asthma   ? BMI 24.0-24.9, adult   ? Cancer [facility])   ? skin cancer  ? Dizziness   ? "I've had inner ear before"  ? Family history of breast cancer   ? Family history of lung cancer   ? Family history of throat cancer   ? Personal history of skin cancer   ? Syncope   ? ? ?Past Surgical History:  ?Procedure Laterality Date  ? ARTHRODESIS METATARSALPHALANGEAL  JOINT (MTPJ) Right 06/28/2021  ? Procedure: Right hallux metatarsophalangeal joint arthrodesis;  Surgeon: [name], MD;  Location: [facility];  Service: Orthopedics;  Laterality: Right;  ? BREAST BIOPSY Right 2016  ? COLONOSCOPY  last 06/18/2013  ? POLYPECTOMY    ? skin cancer removal    ? x4  ? ? ?Current Outpatient Medications  ?Medication Sig Dispense Refill  ? Cholecalciferol (VITAMIN D3 PO) Take by mouth.    ? docusate sodium (COLACE) 100 MG capsule Take 1 capsule (100 mg total) by mouth 2 (two) times daily. While taking narcotic pain medicine. 30 capsule 0  ? MAGNESIUM PO Take by mouth.    ? valACYclovir (VALTREX) 500 MG tablet TAKE 4 TABLETS BY MOUTH EVERY 12 HOURS FOR 2 DOSES AS NEEDED 90 tablet 0  ? vitamin C (ASCORBIC ACID) 500 MG tablet Take 500 mg by mouth daily.    ? ?No current facility-administered medications for this visit.  ? ? ?Family History  ?Problem Relation Age of Onset  ? Lung cancer Mother   ? Heart attack Mother   ? Colon polyps Mother   ? Throat cancer Father   ? Dementia Father   ? Heart disease Father   ? Stroke Father   ? Breast cancer Paternal Aunt 22  ? Thyroid  disease Paternal Aunt   ? Breast cancer Paternal Grandmother 64  ? Dementia Maternal Grandmother   ? Emphysema Maternal Grandfather   ? Heart attack Paternal Grandfather   ? Seizures Neg Hx   ? Colon cancer Neg Hx   ? Esophageal cancer Neg Hx   ? Rectal cancer Neg Hx   ? Stomach cancer Neg Hx   ? ? ?Review of Systems  ?All other systems reviewed and are negative. ? ?Exam:   ?BP 110/64   Pulse 73   Ht '5\' 5"'$  (1.651 m)   Wt 155 lb (70.3 kg)   LMP 12/01/2011   SpO2 100%   BMI 25.79 kg/m?   Weight change: '@WEIGHTCHANGE'$ @ Height:   Height: '5\' 5"'$  (165.1 cm)  ?Ht Readings from Last 3 Encounters:  ?10/24/21 '5\' 5"'$  (1.651 m)  ?06/28/21 '5\' 5"'$  (1.651 m)  ?10/18/20 '5\' 5"'$  (1.651 m)  ? ? ?General appearance: alert, cooperative and appears stated age ?Head: Normocephalic, without obvious abnormality, atraumatic ?Neck: no  adenopathy, supple, symmetrical, trachea midline and thyroid normal to inspection and palpation ?Lungs: clear to auscultation bilaterally ?Cardiovascular: regular rate and rhythm ?Breasts: normal appearance, no masses or tenderness ?Abdomen: soft, non-tender; non distended,  no masses,  no organomegaly ?Extremities: extremities normal, atraumatic, no cyanosis or edema ?Skin: Skin color, texture, turgor normal. Mild erythematous rash in both axilla and left groin. ?Lymph nodes: Cervical, supraclavicular, and axillary nodes normal. ?No abnormal inguinal nodes palpated ?Neurologic: Grossly normal ? ? ?Pelvic: External genitalia:  no lesions ?             Urethra:  normal appearing urethra with no masses, tenderness or lesions ?             Bartholins and Skenes: normal    ?             Vagina: normal appearing vagina with normal color and discharge, no lesions ?             Cervix: no lesions ?              ?Bimanual Exam:  Uterus:  normal size, contour, position, consistency, mobility, non-tender ?             Adnexa: no mass, fullness, tenderness ?              Rectovaginal: Confirms ?              Anus:  normal sphincter tone, no lesions ? ?Gae Dry chaperoned for the exam. ? ?1. Well woman exam ?Discussed breast self exam ?Discussed calcium and vit D intake ?Labs with primary ? ?2. Increased risk of breast cancer ?Mammogram due in 7/23 ?Abbreviated MRI due in 11/23, we discussed possible risks with the contrast ? ?3. H/O cold sores ?- valACYclovir (VALTREX) 500 MG tablet; TAKE 4 TABLETS BY MOUTH EVERY 12 HOURS FOR 2 DOSES AS NEEDED  Dispense: 90 tablet; Refill: 0 ? ?4. Vitamin D deficiency ?On vit D ? ?5. Rash ?Suspect candida ?- nystatin cream (MYCOSTATIN); Apply 1 application. topically 2 (two) times daily. Apply to affected area BID for up to 7 days.  Dispense: 30 g; Refill: 0 ?-If her rash doesn't improve, she will f/u with Dermatology ? ?Addendum: ? ?6. Screening for cervical cancer ?- Cytology - PAP ? ?

## 2021-10-24 ENCOUNTER — Other Ambulatory Visit (HOSPITAL_COMMUNITY)
Admission: RE | Admit: 2021-10-24 | Discharge: 2021-10-24 | Disposition: A | Discharge status: Home / Self Care | Payer: Managed Care, Other (non HMO) | Source: Ambulatory Visit | Attending: Obstetrics and Gynecology | Admitting: Obstetrics and Gynecology

## 2021-10-24 ENCOUNTER — Encounter: Payer: Self-pay | Admitting: Obstetrics and Gynecology

## 2021-10-24 ENCOUNTER — Ambulatory Visit (INDEPENDENT_AMBULATORY_CARE_PROVIDER_SITE_OTHER): Payer: Managed Care, Other (non HMO) | Admitting: Obstetrics and Gynecology

## 2021-10-24 VITALS — BP 110/64 | HR 73 | Ht 65.0 in | Wt 155.0 lb

## 2021-10-24 DIAGNOSIS — Z01419 Encounter for gynecological examination (general) (routine) without abnormal findings: Secondary | ICD-10-CM | POA: Diagnosis not present

## 2021-10-24 DIAGNOSIS — Z124 Encounter for screening for malignant neoplasm of cervix: Secondary | ICD-10-CM | POA: Insufficient documentation

## 2021-10-24 DIAGNOSIS — Z8619 Personal history of other infectious and parasitic diseases: Secondary | ICD-10-CM

## 2021-10-24 DIAGNOSIS — E559 Vitamin D deficiency, unspecified: Secondary | ICD-10-CM | POA: Diagnosis not present

## 2021-10-24 DIAGNOSIS — Z9189 Other specified personal risk factors, not elsewhere classified: Secondary | ICD-10-CM | POA: Diagnosis not present

## 2021-10-24 DIAGNOSIS — R21 Rash and other nonspecific skin eruption: Secondary | ICD-10-CM

## 2021-10-24 MED ORDER — VALACYCLOVIR HCL 500 MG PO TABS: ORAL_TABLET | ORAL | 0 refills | Status: DC

## 2021-10-24 MED ORDER — NYSTATIN 100000 UNIT/GM EX CREA: 1.0000 "application " | TOPICAL_CREAM | Freq: Two times a day (BID) | CUTANEOUS | 0 refills | Status: AC

## 2021-10-24 NOTE — Patient Instructions (Signed)
Kegel Exercises ? ?Kegel exercises can help strengthen your pelvic floor muscles. The pelvic floor is a group of muscles that support your rectum, small intestine, and bladder. In females, pelvic floor muscles also help support the uterus. These muscles help you control the flow of urine and stool (feces). ?Kegel exercises are painless and simple. They do not require any equipment. Your provider may suggest Kegel exercises to: ?Improve bladder and bowel control. ?Improve sexual response. ?Improve weak pelvic floor muscles after surgery to remove the uterus (hysterectomy) or after pregnancy, in females. ?Improve weak pelvic floor muscles after prostate gland removal or surgery, in males. ?Kegel exercises involve squeezing your pelvic floor muscles. These are the same muscles you squeeze when you try to stop the flow of urine or keep from passing gas. The exercises can be done while sitting, standing, or lying down, but it is best to vary your position. ?Ask your health care provider which exercises are safe for you. Do exercises exactly as told by your health care provider and adjust them as directed. Do not begin these exercises until told by your health care provider. ?Exercises ?How to do Kegel exercises: ?Squeeze your pelvic floor muscles tight. You should feel a tight lift in your rectal area. If you are a female, you should also feel a tightness in your vaginal area. Keep your stomach, buttocks, and legs relaxed. ?Hold the muscles tight for up to 10 seconds. ?Breathe normally. ?Relax your muscles for up to 10 seconds. ?Repeat as told by your health care provider. ?Repeat this exercise daily as told by your health care provider. Continue to do this exercise for at least 4-6 weeks, or for as long as told by your health care provider. ?You may be referred to a physical therapist who can help you learn more about how to do Kegel exercises. ?Depending on your condition, your health care provider may  recommend: ?Varying how long you squeeze your muscles. ?Doing several sets of exercises every day. ?Doing exercises for several weeks. ?Making Kegel exercises a part of your regular exercise routine. ?This information is not intended to replace advice given to you by your health care provider. Make sure you discuss any questions you have with your health care provider. ?Document Revised: 10/05/2020 Document Reviewed: 10/05/2020 ?Elsevier Patient Education ? [place]. ?Urinary Incontinence ?Urinary incontinence refers to a condition in which a person is unable to control where and when to pass urine. A person with this condition will urinate involuntarily. This means that the person urinates when he or she does not mean to. ?What are the causes? ?This condition may be caused by: ?Medicines. ?Infections. ?Constipation. ?Overactive bladder muscles. ?Weak bladder muscles. ?Weak pelvic floor muscles. These muscles provide support for the bladder, intestine, and, in women, the uterus. ?Enlarged prostate in men. The prostate is a gland near the bladder. When it gets too big, it can pinch the urethra. With the urethra blocked, the bladder can weaken and lose the ability to empty properly. ?Surgery. ?Emotional factors, such as anxiety, stress, or post-traumatic stress disorder (PTSD). ?Spinal cord injury, nerve injury, or other neurological conditions. ?Pelvic organ prolapse. This happens in women when organs move out of place and into the vagina. This movement can prevent the bladder and urethra from working properly. ?What increases the risk? ?The following factors may make you more likely to develop this condition: ?Age. The older you are, the higher the risk. ?Obesity. ?Being physically inactive. ?Pregnancy and childbirth. ?Menopause. ?Diseases that affect  the nerves or spinal cord. ?Long-term, or chronic, coughing. This can increase pressure on the bladder and pelvic floor muscles. ?What are the signs or  symptoms? ?Symptoms may vary depending on the type of urinary incontinence you have. They include: ?A sudden urge to urinate, and passing urine involuntarily before you can get to a bathroom (urge incontinence). ?Suddenly passing urine when doing activities that force urine to pass, such as coughing, laughing, exercising, or sneezing (stress incontinence). ?Needing to urinate often but urinating only a small amount, or constantly dribbling urine (overflow incontinence). ?Urinating because you cannot get to the bathroom in time due to a physical disability, such as arthritis or injury, or due to a communication or thinking problem, such as Alzheimer's disease (functional incontinence). ?How is this diagnosed? ?This condition may be diagnosed based on: ?Your medical history. ?A physical exam. ?Tests, such as: ?Urine tests. ?X-rays of your kidney and bladder. ?Ultrasound. ?CT scan. ?Cystoscopy. In this procedure, a health care provider inserts a tube with a light and camera (cystoscope) through the urethra and into the bladder to check for problems. ?Urodynamic testing. These tests assess how well the bladder, urethra, and sphincter can store and release urine. There are different types of urodynamic tests, and they vary depending on what the test is measuring. ?To help diagnose your condition, your health care provider may recommend that you keep a log of when you urinate and how much you urinate. ?How is this treated? ?Treatment for this condition depends on the type of incontinence that you have and its cause. Treatment may include: ?Lifestyle changes, such as: ?Quitting smoking. ?Maintaining a healthy weight. ?Staying active. Try to get 150 minutes of moderate-intensity exercise every week. Ask your health care provider which activities are safe for you. ?Eating a healthy diet. ?Avoid high-fat foods, like fried foods. ?Avoid refined carbohydrates like white bread and white rice. ?Limit how much alcohol and caffeine  you drink. ?Increase your fiber intake. Healthy sources of fiber include beans, whole grains, and fresh fruits and vegetables. ?Behavioral changes, such as: ?Pelvic floor muscle exercises. ?Bladder training, such as lengthening the amount of time between bathroom breaks, or using the bathroom at regular intervals. ?Using techniques to suppress bladder urges. This can include distraction techniques or controlled breathing exercises. ?Medicines, such as: ?Medicines to relax the bladder muscles and prevent bladder spasms. ?Medicines to help slow or prevent the growth of a man's prostate. ?Botox injections. These can help relax the bladder muscles. ?Treatments, such as: ?Using pulses of electricity to help change bladder reflexes (electrical nerve stimulation). ?For women, using a medical device to prevent urine leaks. This is a small, tampon-like, disposable device that is inserted into the urethra. ?Injecting collagen or carbon beads (bulking agents) into the urinary sphincter. These can help thicken tissue and close the bladder opening. ?Surgery. ?Follow these instructions at home: ?Lifestyle ?Limit alcohol and caffeine. These can fill your bladder quickly and irritate it. ?Keep yourself clean to help prevent odors and skin damage. Ask your health care provider about special skin creams and cleansers that can protect the skin from urine. ?Consider wearing pads or adult diapers. Make sure to change them regularly, and always change them right after experiencing incontinence. ?General instructions ?Take over-the-counter and prescription medicines only as told by your health care provider. ?Use the bathroom about every 3-4 hours, even if you do not feel the need to urinate. Try to empty your bladder completely every time. After urinating, wait a minute. Then try to urinate  again. ?Make sure you are in a relaxed position while urinating. ?If your incontinence is caused by nerve problems, keep a log of the medicines you  take and the times you go to the bathroom. ?Keep all follow-up visits. This is important. ?Where to find more information ?[employer] of Diabetes and Digestive and Kidney Diseases: [email or web address] ?[employer]

## 2021-10-24 NOTE — Addendum Note (Signed)
Addended by: Dorothy Spark on: 10/24/2021 04:19 PM ? ? Modules accepted: Orders ? ?

## 2021-10-30 LAB — CYTOLOGY - PAP
Comment: NEGATIVE
Diagnosis: NEGATIVE
High risk HPV: NEGATIVE

## 2022-01-02 ENCOUNTER — Other Ambulatory Visit: Payer: Self-pay | Admitting: Obstetrics and Gynecology

## 2022-01-02 DIAGNOSIS — Z1231 Encounter for screening mammogram for malignant neoplasm of breast: Secondary | ICD-10-CM

## 2022-01-22 ENCOUNTER — Ambulatory Visit: Payer: 59

## 2022-02-20 ENCOUNTER — Ambulatory Visit: Payer: 59

## 2022-03-08 ENCOUNTER — Ambulatory Visit
Admission: RE | Admit: 2022-03-08 | Discharge: 2022-03-08 | Disposition: A | Discharge status: Home / Self Care | Payer: 59 | Source: Ambulatory Visit | Attending: Obstetrics and Gynecology | Admitting: Obstetrics and Gynecology

## 2022-03-08 DIAGNOSIS — Z1231 Encounter for screening mammogram for malignant neoplasm of breast: Secondary | ICD-10-CM

## 2022-08-09 ENCOUNTER — Telehealth: Payer: Self-pay

## 2022-08-09 DIAGNOSIS — Z9189 Other specified personal risk factors, not elsewhere classified: Secondary | ICD-10-CM

## 2022-08-09 NOTE — Telephone Encounter (Signed)
FYI. Pt calling to report that it is time to have her breast MRI scheduled. She just needs her order sent to [facility].  Last AEX 10/24/2021--nothing currently scheduled. Recall letter sent. Last mammo 03/08/2022- neg birads 1 Last breast MRI 04/17/2021-neg birads 1  Called pt to confirm what kind of MRI she received because past orders were placed for an abbreviated MRI when no insurance is filed and pt has a flat rate to pay.  Pt desired for [place] to place order for traditional breast MRI so that we can do a PA w/ insurance and then advise her of coverage and any potential of OOP costs.   MRI order placed.

## 2022-09-03 ENCOUNTER — Inpatient Hospital Stay: Admission: RE | Admit: 2022-09-03 | Payer: Managed Care, Other (non HMO) | Source: Ambulatory Visit

## 2022-09-11 NOTE — Telephone Encounter (Addendum)
Pt scheduled for MRI on 09/22/2022.  Msg sent to PA coordinator to start PA process.

## 2022-09-17 NOTE — Telephone Encounter (Signed)
Per MM: PA has already been worked. Will route to provider for final review and close encounter.

## 2022-09-22 ENCOUNTER — Other Ambulatory Visit: Payer: Managed Care, Other (non HMO)

## 2022-10-15 ENCOUNTER — Ambulatory Visit
Admission: RE | Admit: 2022-10-15 | Discharge: 2022-10-15 | Disposition: A | Discharge status: Home / Self Care | Payer: Managed Care, Other (non HMO) | Source: Ambulatory Visit | Attending: Obstetrics and Gynecology

## 2022-10-15 DIAGNOSIS — Z9189 Other specified personal risk factors, not elsewhere classified: Secondary | ICD-10-CM

## 2022-10-15 MED ORDER — GADOPICLENOL 0.5 MMOL/ML IV SOLN
7.0000 mL | Freq: Once | INTRAVENOUS | Status: AC | PRN
  Administered 2022-10-15: 7 mL via INTRAVENOUS

## 2023-02-05 ENCOUNTER — Ambulatory Visit: Payer: Managed Care, Other (non HMO) | Admitting: Radiology

## 2023-02-11 ENCOUNTER — Ambulatory Visit (INDEPENDENT_AMBULATORY_CARE_PROVIDER_SITE_OTHER): Payer: Managed Care, Other (non HMO) | Admitting: Radiology

## 2023-02-11 ENCOUNTER — Encounter: Payer: Self-pay | Admitting: Radiology

## 2023-02-11 VITALS — BP 104/72 | Ht 63.5 in | Wt 150.0 lb

## 2023-02-11 DIAGNOSIS — Z9189 Other specified personal risk factors, not elsewhere classified: Secondary | ICD-10-CM

## 2023-02-11 DIAGNOSIS — Z01419 Encounter for gynecological examination (general) (routine) without abnormal findings: Secondary | ICD-10-CM | POA: Diagnosis not present

## 2023-02-11 DIAGNOSIS — E559 Vitamin D deficiency, unspecified: Secondary | ICD-10-CM

## 2023-02-11 DIAGNOSIS — Z8619 Personal history of other infectious and parasitic diseases: Secondary | ICD-10-CM

## 2023-02-11 MED ORDER — VALACYCLOVIR HCL 500 MG PO TABS: ORAL_TABLET | ORAL | 0 refills | Status: DC

## 2023-02-11 NOTE — Progress Notes (Signed)
   Katherine Ritter 12/17/1965 [id number]   History: Postmenopausal 57 y.o. presents for annual exam. Doing well on HRT, managed by another provider. Family hx of breast cancer, yearly mammo and MRI negative. No new gyn concerns. Exercises regularly.    Gynecologic History Postmenopausal Last Pap: 2023. Results were: normal Last mammogram: 9/23. Results were: normal Last colonoscopy: 2023   Obstetric History OB History  Gravida Para Term Preterm AB Living  2 2 2     2   SAB IAB Ectopic Multiple Live Births          2    # Outcome Date GA Lbr Len/2nd Weight Sex Type Anes PTL Lv  2 Term      Vag-Spont   LIV  1 Term      Vag-Spont   LIV     The following portions of the patient's history were reviewed and updated as appropriate: allergies, current medications, past family history, past medical history, past social history, past surgical history, and problem list.  Review of Systems Pertinent items noted in HPI and remainder of comprehensive ROS otherwise negative.  Past medical history, past surgical history, family history and social history were all reviewed and documented in the EPIC chart.  Exam:  Vitals:   02/11/23 1136  BP: 104/72  Weight: 150 lb (68 kg)  Height: 5' 3.5" (1.613 m)   Body mass index is 26.15 kg/m.  General appearance:  Normal Thyroid:  Symmetrical, normal in size, without palpable masses or nodularity. Respiratory  Auscultation:  Clear without wheezing or rhonchi Cardiovascular  Auscultation:  Regular rate, without rubs, murmurs or gallops  Edema/varicosities:  Not grossly evident Abdominal  Soft,nontender, without masses, guarding or rebound.  Liver/spleen:  No organomegaly noted  Hernia:  None appreciated  Skin  Inspection:  Grossly normal Breasts: Examined lying and sitting.   Right: Without masses, retractions, nipple discharge or axillary adenopathy.   Left: Without masses, retractions, nipple discharge or axillary  adenopathy. Genitourinary   Inguinal/mons:  Normal without inguinal adenopathy  External genitalia:  Normal appearing vulva with no masses, tenderness, or lesions  BUS/Urethra/Skene's glands:  Normal  Vagina:  Normal appearing with normal color and discharge, no lesions. Atrophy: mild   Cervix:  Normal appearing without discharge or lesions  Uterus:  Normal in size, shape and contour.  Midline and mobile, nontender  Adnexa/parametria:     Rt: Normal in size, without masses or tenderness.   Lt: Normal in size, without masses or tenderness.  Anus and perineum: Normal    Clyde Lundborg, CMA present for exam  Assessment/Plan:   1. Well woman exam with routine gynecological exam Pap 2026 Doing well on HRT, plans to continue  2. Increased risk of breast cancer Mammogram due this month, MRI late spring  3. H/O cold sores - valACYclovir (VALTREX) 500 MG tablet; TAKE 4 TABLETS BY MOUTH EVERY 12 HOURS FOR 2 DOSES AS NEEDED  Dispense: 90 tablet; Refill: 0     Discussed SBE, colonoscopy and DEXA screening as directed. Recommend of exercise weekly, including weight bearing exercise. Encouraged the use of seatbelts and sunscreen.  Return in 1 year for annual or sooner prn.  Arlie Solomons B WHNP-BC, 11:55 AM 02/11/2023

## 2023-02-25 ENCOUNTER — Other Ambulatory Visit: Payer: Self-pay | Admitting: Radiology

## 2023-02-25 DIAGNOSIS — Z8619 Personal history of other infectious and parasitic diseases: Secondary | ICD-10-CM

## 2023-02-25 NOTE — Telephone Encounter (Signed)
Requesting new refill for 540 tablets with 1 RF

## 2023-05-28 ENCOUNTER — Other Ambulatory Visit: Payer: Self-pay | Admitting: Radiology

## 2023-05-28 DIAGNOSIS — Z1231 Encounter for screening mammogram for malignant neoplasm of breast: Secondary | ICD-10-CM

## 2023-06-17 ENCOUNTER — Ambulatory Visit
Admission: RE | Admit: 2023-06-17 | Discharge: 2023-06-17 | Disposition: A | Discharge status: Home / Self Care | Payer: Managed Care, Other (non HMO) | Source: Ambulatory Visit

## 2023-06-17 DIAGNOSIS — Z1231 Encounter for screening mammogram for malignant neoplasm of breast: Secondary | ICD-10-CM

## 2023-06-18 ENCOUNTER — Other Ambulatory Visit: Payer: Self-pay | Admitting: Radiology

## 2023-06-18 DIAGNOSIS — R928 Other abnormal and inconclusive findings on diagnostic imaging of breast: Secondary | ICD-10-CM

## 2023-07-02 ENCOUNTER — Ambulatory Visit: Payer: Managed Care, Other (non HMO)

## 2023-07-02 ENCOUNTER — Ambulatory Visit
Admission: RE | Admit: 2023-07-02 | Discharge: 2023-07-02 | Disposition: A | Discharge status: Home / Self Care | Payer: Commercial Managed Care - PPO | Source: Ambulatory Visit | Attending: Radiology | Admitting: Radiology

## 2023-07-02 DIAGNOSIS — R928 Other abnormal and inconclusive findings on diagnostic imaging of breast: Secondary | ICD-10-CM

## 2024-02-13 ENCOUNTER — Ambulatory Visit: Payer: Managed Care, Other (non HMO) | Admitting: Radiology

## 2024-02-18 ENCOUNTER — Encounter: Payer: Self-pay | Admitting: Radiology

## 2024-02-18 ENCOUNTER — Ambulatory Visit (INDEPENDENT_AMBULATORY_CARE_PROVIDER_SITE_OTHER): Admitting: Radiology

## 2024-02-18 VITALS — BP 122/74 | HR 110 | Ht 63.75 in | Wt 151.0 lb

## 2024-02-18 DIAGNOSIS — Z1331 Encounter for screening for depression: Secondary | ICD-10-CM | POA: Diagnosis not present

## 2024-02-18 DIAGNOSIS — Z01419 Encounter for gynecological examination (general) (routine) without abnormal findings: Secondary | ICD-10-CM | POA: Diagnosis not present

## 2024-02-18 DIAGNOSIS — Z79890 Hormone replacement therapy (postmenopausal): Secondary | ICD-10-CM | POA: Diagnosis not present

## 2024-02-18 DIAGNOSIS — Z9189 Other specified personal risk factors, not elsewhere classified: Secondary | ICD-10-CM

## 2024-02-18 NOTE — Progress Notes (Signed)
   Katherine Ritter June 01, 1966 [id number]   History: Postmenopausal 58 y.o. presents for annual exam. Doing well, no new concerns. HRT managed by Katherine Ritter. Mother passed away earlier this year. Increased risk for breast cancer, overdue for breast MRI, needs order.   Gynecologic History Postmenopausal Last Pap: 5/23. Results were: normal Last mammogram: 1/25. Results were: normal Last colonoscopy: 2023   Obstetric History OB History  Gravida Para Term Preterm AB Living  2 2 2   2   SAB IAB Ectopic Multiple Live Births      2    # Outcome Date GA Lbr Len/2nd Weight Sex Type Anes PTL Lv  2 Term      Vag-Spont   LIV  1 Term      Vag-Spont   LIV       02/18/2024    8:12 AM  Depression screen PHQ 2/9  Decreased Interest 0  Down, Depressed, Hopeless 0  PHQ - 2 Score 0     The following portions of the patient's history were reviewed and updated as appropriate: allergies, current medications, past family history, past medical history, past social history, past surgical history, and problem list.  Review of Systems Pertinent items noted in HPI and remainder of comprehensive ROS otherwise negative.  Past medical history, past surgical history, family history and social history were all reviewed and documented in the EPIC chart.  Exam:  Vitals:   02/18/24 0811  BP: 122/74  Pulse: (!) 110  SpO2: 97%  Weight: 151 lb (68.5 kg)  Height: 5' 3.75 (1.619 m)   Body mass index is 26.12 kg/m.  General appearance:  Normal Thyroid:  Symmetrical, normal in size, without palpable masses or nodularity. Respiratory  Auscultation:  Clear without wheezing or rhonchi Cardiovascular  Auscultation:  Regular rate, without rubs, murmurs or gallops  Edema/varicosities:  Not grossly evident Abdominal  Soft,nontender, without masses, guarding or rebound.  Liver/spleen:  No organomegaly noted  Hernia:  None appreciated  Skin  Inspection:  Grossly normal Breasts: Examined lying and  sitting.   Right: Without masses, retractions, nipple discharge or axillary adenopathy.   Left: Without masses, retractions, nipple discharge or axillary adenopathy. Genitourinary   Inguinal/mons:  Normal without inguinal adenopathy  External genitalia:  Normal appearing vulva with no masses, tenderness, or lesions  BUS/Urethra/Skene's glands:  Normal  Vagina:  Normal appearing with normal color and discharge, no lesions.   Cervix:  Normal appearing without discharge or lesions  Uterus:  Normal in size, shape and contour.  Midline and mobile, nontender  Adnexa/parametria:     Rt: Normal in size, without masses or tenderness.   Lt: Normal in size, without masses or tenderness.  Anus and perineum: Normal    Katherine Ritter, CMA present for exam  Assessment/Plan:   1. Well woman exam with routine gynecological exam (Primary) Pap 2026  2. Increased risk of breast cancer - MR BREAST BILATERAL W WO CONTRAST INC CAD; Future  3. Post-menopause on HRT (hormone replacement therapy) Managed by Katherine Ritter. Will contact us  if she would like us  to manage  4. Depression screening    Return in 1 year for annual or sooner prn.  Craven Crean B WHNP-BC, 8:47 AM 02/18/2024

## 2024-03-03 ENCOUNTER — Encounter: Payer: Self-pay | Admitting: Radiology

## 2024-03-14 ENCOUNTER — Ambulatory Visit
Admission: RE | Admit: 2024-03-14 | Discharge: 2024-03-14 | Disposition: A | Discharge status: Home / Self Care | Source: Ambulatory Visit | Attending: Radiology | Admitting: Radiology

## 2024-03-14 DIAGNOSIS — Z9189 Other specified personal risk factors, not elsewhere classified: Secondary | ICD-10-CM

## 2024-03-14 MED ORDER — GADOPICLENOL 0.5 MMOL/ML IV SOLN
8.0000 mL | Freq: Once | INTRAVENOUS | Status: AC | PRN
  Administered 2024-03-14: 6 mL via INTRAVENOUS

## 2024-03-17 DIAGNOSIS — R928 Other abnormal and inconclusive findings on diagnostic imaging of breast: Secondary | ICD-10-CM

## 2024-03-17 DIAGNOSIS — Z9189 Other specified personal risk factors, not elsewhere classified: Secondary | ICD-10-CM

## 2024-03-18 NOTE — Telephone Encounter (Signed)
 03/14/24 breast MRI reviewed.   Orders pended.   Routing to Jami

## 2024-03-18 NOTE — Telephone Encounter (Signed)
 Please order biopsy and follow up with [facility].

## 2024-03-18 NOTE — Telephone Encounter (Signed)
 Per review of EPIC, patient is scheduled for breast bx on 03/29/24.   Encounter closed.

## 2024-03-18 NOTE — Telephone Encounter (Signed)
 Patient notified. Order placed. EPIC staff message to [facility] nurse navigator for scheduling. Patient aware to call if any questions or assistance needed.

## 2024-03-18 NOTE — Telephone Encounter (Signed)
 Patient left message requesting call back regarding results.

## 2024-03-18 NOTE — Telephone Encounter (Signed)
 They should have contacted her with results and to schedule the biopsy by now.

## 2024-03-29 ENCOUNTER — Ambulatory Visit
Admission: RE | Admit: 2024-03-29 | Discharge: 2024-03-29 | Disposition: A | Source: Ambulatory Visit | Attending: Radiology | Admitting: Radiology

## 2024-03-29 DIAGNOSIS — R928 Other abnormal and inconclusive findings on diagnostic imaging of breast: Secondary | ICD-10-CM

## 2024-03-29 DIAGNOSIS — Z9189 Other specified personal risk factors, not elsewhere classified: Secondary | ICD-10-CM

## 2024-03-29 HISTORY — PX: BREAST BIOPSY: SHX20

## 2024-03-29 MED ORDER — GADOPICLENOL 0.5 MMOL/ML IV SOLN
7.0000 mL | Freq: Once | INTRAVENOUS | Status: AC | PRN
  Administered 2024-03-29: 7 mL via INTRAVENOUS

## 2024-03-31 LAB — SURGICAL PATHOLOGY

## 2024-07-06 ENCOUNTER — Other Ambulatory Visit: Payer: Self-pay | Admitting: Radiology

## 2024-07-06 DIAGNOSIS — Z1231 Encounter for screening mammogram for malignant neoplasm of breast: Secondary | ICD-10-CM

## 2024-07-06 DIAGNOSIS — Z9189 Other specified personal risk factors, not elsewhere classified: Secondary | ICD-10-CM

## 2024-07-13 ENCOUNTER — Ambulatory Visit

## 2024-07-23 ENCOUNTER — Ambulatory Visit

## 2024-08-24 ENCOUNTER — Other Ambulatory Visit

## 2025-02-25 ENCOUNTER — Ambulatory Visit: Admitting: Radiology
